# Patient Record
Sex: Female | Born: 1985 | Race: Asian | Hispanic: No | Marital: Married | Smoking: Never smoker
Health system: Southern US, Community
[De-identification: ages and names within clinical notes are randomized; demographics above are authoritative.]

---

## 2003-07-26 ENCOUNTER — Inpatient Hospital Stay (HOSPITAL_COMMUNITY): Admission: EM | Admit: 2003-07-26 | Discharge: 2003-07-27 | Payer: Self-pay

## 2003-12-31 ENCOUNTER — Other Ambulatory Visit: Admission: RE | Admit: 2003-12-31 | Discharge: 2003-12-31 | Payer: Self-pay | Admitting: Obstetrics and Gynecology

## 2004-03-25 ENCOUNTER — Other Ambulatory Visit: Admission: RE | Admit: 2004-03-25 | Discharge: 2004-03-25 | Payer: Self-pay | Admitting: Obstetrics and Gynecology

## 2004-06-09 ENCOUNTER — Inpatient Hospital Stay (HOSPITAL_COMMUNITY): Admission: AD | Admit: 2004-06-09 | Discharge: 2004-06-12 | Payer: Self-pay | Admitting: Family Medicine

## 2004-06-09 ENCOUNTER — Ambulatory Visit: Payer: Self-pay | Admitting: Family Medicine

## 2004-07-08 ENCOUNTER — Other Ambulatory Visit: Admission: RE | Admit: 2004-07-08 | Discharge: 2004-07-08 | Payer: Self-pay | Admitting: Obstetrics & Gynecology

## 2005-10-26 ENCOUNTER — Ambulatory Visit (HOSPITAL_COMMUNITY): Admission: RE | Admit: 2005-10-26 | Discharge: 2005-10-26 | Payer: Self-pay | Admitting: Obstetrics & Gynecology

## 2006-03-05 ENCOUNTER — Ambulatory Visit (HOSPITAL_COMMUNITY): Admission: RE | Admit: 2006-03-05 | Discharge: 2006-03-05 | Payer: Self-pay | Admitting: Obstetrics & Gynecology

## 2006-03-07 ENCOUNTER — Inpatient Hospital Stay (HOSPITAL_COMMUNITY): Admission: AD | Admit: 2006-03-07 | Discharge: 2006-03-09 | Payer: Self-pay | Admitting: Obstetrics & Gynecology

## 2018-09-12 LAB — OB RESULTS CONSOLE HIV ANTIBODY (ROUTINE TESTING): HIV: NONREACTIVE

## 2018-09-12 LAB — OB RESULTS CONSOLE HEPATITIS B SURFACE ANTIGEN: Hepatitis B Surface Ag: NEGATIVE

## 2018-09-12 LAB — OB RESULTS CONSOLE RPR: RPR: NONREACTIVE

## 2018-09-12 LAB — OB RESULTS CONSOLE GC/CHLAMYDIA
Chlamydia: NEGATIVE
Gonorrhea: NEGATIVE

## 2018-09-12 LAB — OB RESULTS CONSOLE RUBELLA ANTIBODY, IGM: Rubella: IMMUNE

## 2018-09-12 LAB — OB RESULTS CONSOLE ABO/RH: RH Type: POSITIVE

## 2018-09-12 LAB — OB RESULTS CONSOLE PLATELET COUNT: Platelets: 256

## 2018-09-12 LAB — OB RESULTS CONSOLE HGB/HCT, BLOOD
HCT: 40 (ref 29–41)
Hemoglobin: 13.3

## 2019-01-20 LAB — OB RESULTS CONSOLE HIV ANTIBODY (ROUTINE TESTING): HIV: NONREACTIVE

## 2019-01-20 LAB — OB RESULTS CONSOLE HGB/HCT, BLOOD
HCT: 41 (ref 29–41)
Hemoglobin: 13.6

## 2019-01-20 LAB — OB RESULTS CONSOLE PLATELET COUNT: Platelets: 253

## 2019-03-20 ENCOUNTER — Emergency Department (HOSPITAL_COMMUNITY): Payer: 59

## 2019-03-20 ENCOUNTER — Encounter (HOSPITAL_COMMUNITY): Payer: Self-pay | Admitting: Emergency Medicine

## 2019-03-20 ENCOUNTER — Observation Stay (HOSPITAL_BASED_OUTPATIENT_CLINIC_OR_DEPARTMENT_OTHER)
Admission: EM | Admit: 2019-03-20 | Discharge: 2019-03-20 | Disposition: A | Discharge status: Home / Self Care | Payer: 59 | Source: Home / Self Care | Attending: Emergency Medicine | Admitting: Emergency Medicine

## 2019-03-20 ENCOUNTER — Other Ambulatory Visit: Payer: Self-pay

## 2019-03-20 DIAGNOSIS — Z3493 Encounter for supervision of normal pregnancy, unspecified, third trimester: Secondary | ICD-10-CM | POA: Diagnosis not present

## 2019-03-20 DIAGNOSIS — Z3A36 36 weeks gestation of pregnancy: Secondary | ICD-10-CM | POA: Insufficient documentation

## 2019-03-20 DIAGNOSIS — Z880 Allergy status to penicillin: Secondary | ICD-10-CM | POA: Insufficient documentation

## 2019-03-20 DIAGNOSIS — R0602 Shortness of breath: Secondary | ICD-10-CM | POA: Diagnosis not present

## 2019-03-20 DIAGNOSIS — O99280 Endocrine, nutritional and metabolic diseases complicating pregnancy, unspecified trimester: Secondary | ICD-10-CM | POA: Insufficient documentation

## 2019-03-20 DIAGNOSIS — Z885 Allergy status to narcotic agent status: Secondary | ICD-10-CM | POA: Insufficient documentation

## 2019-03-20 DIAGNOSIS — R110 Nausea: Secondary | ICD-10-CM

## 2019-03-20 DIAGNOSIS — J1289 Other viral pneumonia: Secondary | ICD-10-CM | POA: Insufficient documentation

## 2019-03-20 DIAGNOSIS — U071 COVID-19: Secondary | ICD-10-CM

## 2019-03-20 DIAGNOSIS — O98519 Other viral diseases complicating pregnancy, unspecified trimester: Secondary | ICD-10-CM | POA: Insufficient documentation

## 2019-03-20 DIAGNOSIS — E872 Acidosis: Secondary | ICD-10-CM | POA: Diagnosis not present

## 2019-03-20 DIAGNOSIS — O9852 Other viral diseases complicating childbirth: Secondary | ICD-10-CM | POA: Diagnosis not present

## 2019-03-20 LAB — CBC WITH DIFFERENTIAL/PLATELET
Abs Immature Granulocytes: 0.06 10*3/uL (ref 0.00–0.07)
Basophils Absolute: 0 10*3/uL (ref 0.0–0.1)
Basophils Relative: 0 %
Eosinophils Absolute: 0 10*3/uL (ref 0.0–0.5)
Eosinophils Relative: 1 %
HCT: 39.6 % (ref 36.0–46.0)
Hemoglobin: 13.3 g/dL (ref 12.0–15.0)
Immature Granulocytes: 1 %
Lymphocytes Relative: 15 %
Lymphs Abs: 0.9 10*3/uL (ref 0.7–4.0)
MCH: 27.7 pg (ref 26.0–34.0)
MCHC: 33.6 g/dL (ref 30.0–36.0)
MCV: 82.5 fL (ref 80.0–100.0)
Monocytes Absolute: 0.4 10*3/uL (ref 0.1–1.0)
Monocytes Relative: 6 %
Neutro Abs: 4.6 10*3/uL (ref 1.7–7.7)
Neutrophils Relative %: 77 %
Platelets: 187 10*3/uL (ref 150–400)
RBC: 4.8 MIL/uL (ref 3.87–5.11)
RDW: 13 % (ref 11.5–15.5)
WBC: 5.9 10*3/uL (ref 4.0–10.5)
nRBC: 0 % (ref 0.0–0.2)

## 2019-03-20 LAB — INFLUENZA PANEL BY PCR (TYPE A & B)
Influenza A By PCR: NEGATIVE
Influenza B By PCR: NEGATIVE

## 2019-03-20 LAB — BASIC METABOLIC PANEL
Anion gap: 11 (ref 5–15)
BUN: 6 mg/dL (ref 6–20)
CO2: 20 mmol/L — ABNORMAL LOW (ref 22–32)
Calcium: 8.4 mg/dL — ABNORMAL LOW (ref 8.9–10.3)
Chloride: 106 mmol/L (ref 98–111)
Creatinine, Ser: 0.58 mg/dL (ref 0.44–1.00)
GFR calc Af Amer: >60 mL/min (ref >60)
GFR calc non Af Amer: >60 mL/min (ref >60)
Glucose, Bld: 84 mg/dL (ref 70–99)
Potassium: 3.8 mmol/L (ref 3.5–5.1)
Sodium: 137 mmol/L (ref 135–145)

## 2019-03-20 LAB — HIV ANTIBODY (ROUTINE TESTING W REFLEX): HIV Screen 4th Generation wRfx: NONREACTIVE

## 2019-03-20 LAB — POC SARS CORONAVIRUS 2 AG -  ED: SARS Coronavirus 2 Ag: NEGATIVE

## 2019-03-20 MED ORDER — DEXTROMETHORPHAN POLISTIREX ER 30 MG/5ML PO SUER: 15.0000 mg | Freq: Every day | ORAL | 0 refills | Status: DC | PRN

## 2019-03-20 MED ORDER — FOLIC ACID 1 MG PO TABS
1.0000 mg | ORAL_TABLET | Freq: Every day | ORAL | Status: DC
  Administered 2019-03-20: 1 mg via ORAL
  Filled 2019-03-20: qty 1

## 2019-03-20 MED ORDER — ENOXAPARIN SODIUM 150 MG/ML ~~LOC~~ SOLN: 1.0000 mg/kg | Freq: Two times a day (BID) | SUBCUTANEOUS | Status: DC

## 2019-03-20 MED ORDER — ADULT MULTIVITAMIN W/MINERALS CH: 1.0000 | ORAL_TABLET | Freq: Every day | ORAL | Status: DC

## 2019-03-20 MED ORDER — ADULT MULTIVITAMIN W/MINERALS CH
1.0000 | ORAL_TABLET | Freq: Every day | ORAL | Status: DC
  Administered 2019-03-20: 1 via ORAL
  Filled 2019-03-20: qty 1

## 2019-03-20 MED ORDER — ACETAMINOPHEN 325 MG PO TABS
650.0000 mg | ORAL_TABLET | Freq: Four times a day (QID) | ORAL | Status: DC | PRN
  Administered 2019-03-20: 650 mg via ORAL
  Filled 2019-03-20: qty 2

## 2019-03-20 MED ORDER — ONDANSETRON HCL 4 MG/2ML IJ SOLN
4.0000 mg | Freq: Four times a day (QID) | INTRAMUSCULAR | Status: DC | PRN
  Filled 2019-03-20: qty 2

## 2019-03-20 MED ORDER — SODIUM CHLORIDE 0.9 % IV SOLN
500.0000 mg | Freq: Once | INTRAVENOUS | Status: AC
  Administered 2019-03-20: 500 mg via INTRAVENOUS
  Filled 2019-03-20: qty 500

## 2019-03-20 MED ORDER — ACETAMINOPHEN 500 MG PO TABS
1000.0000 mg | ORAL_TABLET | Freq: Once | ORAL | Status: AC
  Administered 2019-03-20: 1000 mg via ORAL
  Filled 2019-03-20: qty 2

## 2019-03-20 MED ORDER — DEXTROMETHORPHAN POLISTIREX ER 30 MG/5ML PO SUER
15.0000 mg | Freq: Every day | ORAL | Status: DC | PRN
  Administered 2019-03-20: 15 mg via ORAL
  Filled 2019-03-20 (×3): qty 5

## 2019-03-20 MED ORDER — ENOXAPARIN SODIUM 40 MG/0.4ML ~~LOC~~ SOLN
40.0000 mg | SUBCUTANEOUS | Status: DC
  Filled 2019-03-20: qty 0.4

## 2019-03-20 MED ORDER — ONDANSETRON HCL 4 MG PO TABS: 4.0000 mg | ORAL_TABLET | Freq: Four times a day (QID) | ORAL | Status: DC | PRN

## 2019-03-20 MED ORDER — FOLIC ACID 1 MG PO TABS: 1.0000 mg | ORAL_TABLET | Freq: Every day | ORAL | Status: DC

## 2019-03-20 NOTE — Discharge Summary (Addendum)
Family Medicine Teaching [facility] Discharge Summary  Patient name: Anna Haynes Medical record number: [id number] Date of birth: [birth year] Age: 33 y.o. Gender: female Date of Admission: 03/20/2019  Date of Discharge: 03/20/2019 Admitting Physician: Moses Manners, MD  Primary Care Provider: Mitchel Honour, DO Consultants: OB/gyn  Indication for Hospitalization: Shortness of breath, COVID-19 infection affecting pregnancy  Discharge Diagnoses/Problem List:  COVID-19 infection/atypical pneumonia Third trimester pregnancy Nausea/fever/fatigue/cough Hypocalcemia  Disposition: Home  Discharge Condition: Stable  Discharge Exam:  BP 104/70   Pulse (!) 103   Temp 99.8 F (37.7 C) (Oral)   Resp (!) 25   SpO2 96%   General: Alert oriented, no acute distress, Card: Regular rhythm, no murmurs appreciated, mildly tachycardic at approximately 105 on evaluation. Pulm: Patient's lungs are clear to auscultation bilaterally.  Patient is taking short breaths.  And is tachypneic in the mid 20s. Abd: Gravid, positive bowel sounds, nontender  Brief Hospital Course:   COVID-19 infection Patient presented to the emergency department with classical symptoms of COVID-19 including cough, muscle aches, fever, loss of taste and smell.  Her positive Covid test was on 12/4.  On evaluation in the ED she was satting in the high 90s on room air.  She was tachypneic in the mid 20s.  Chest x-ray showed patchy pulmonary opacities worrisome for atypical pneumonia.  Emergency room providers gave azithromycin 500 mg for 1 dose in the ED.  Family medicine paged for admission and she was admitted for observation.  On evaluation in the emergency department the patient remained to sat in the high 90s on room air.  She did complain of side pain from coughing but was not coughing on exam.  WBC-5.9, Hgb-13.3, patient's calcium was found to be mildly low at 8.4.  Primary team called her obstetrician who recommended  an NST to evaluate baby. Per Dr. Rana Snare if NST wnl can follow up as outpatient with OB.  NST was reassuring and patient was cleared by obstetrics for discharge.  Patient was discharged with dextromethorphan to help with cough.  He is instructed to follow-up with her OB after her quarantine has been completed.  If she had any questions or concerns she could reach out to her primary care provider or her obstetrician.  Issues for Follow Up:  1. COVID-19 infection, diagnosis 12/4 2. Third trimester pregnancy 3. Monitor O2 status and ensure no respiratory distress  4. Advised to quarantine   Significant Procedures:  03/20/2019-NST performed  Significant Labs and Imaging:  Recent Labs  Lab 03/20/19 0152  WBC 5.9  HGB 13.3  HCT 39.6  PLT 187   Recent Labs  Lab 03/20/19 0152  NA 137  K 3.8  CL 106  CO2 20*  GLUCOSE 84  BUN 6  CREATININE 0.58  CALCIUM 8.4*    Chest x-ray-lung volumes are low and patchy pulmonary opacities is worrisome for atypical pneumonia  Results/Tests Pending at Time of Discharge: None  Discharge Medications:  Allergies as of 03/20/2019      Reactions   Amoxicillin Hives   Codeine Hives      Medication List    STOP taking these medications   guaiFENesin 600 MG 12 hr tablet Commonly known as: MUCINEX     TAKE these medications   acetaminophen 325 MG tablet Commonly known as: TYLENOL Take 650 mg by mouth every 6 (six) hours as needed for mild pain or fever.   dextromethorphan 30 MG/5ML liquid Commonly known as: DELSYM Take 2.5 mLs (15 mg  total) by mouth daily as needed for cough.   folic acid 1 MG tablet Commonly known as: FOLVITE Take 1 tablet (1 mg total) by mouth daily. Start taking on: March 21, 2019   multivitamin with minerals Tabs tablet Take 1 tablet by mouth daily. Start taking on: March 21, 2019       Discharge Instructions: Please refer to Patient Instructions section of EMR for full details.  Patient was counseled  important signs and symptoms that should prompt return to medical care, changes in medications, dietary instructions, activity restrictions, and follow up appointments.   Follow-Up Appointments:   Follow-up Information    Morris, Jinny Blossom, DO. Call in 1 day(s).   Specialty: Obstetrics and Gynecology Why: Please call to schedule a follow up appointment with your OB/GYN ASAP Contact information: [address] [place], [place] [address] [phone]           Gifford Shave, MD 03/20/2019, 11:31 AM PGY-1, [place] Upper-Level Resident Addendum   I have discussed the above with the original author and agree with their documentation. My edits for correction/addition/clarification are above. Please see also any attending notes.   Caroline More, DO PGY-3, [place] [facility] 03/20/2019 11:35 AM  FPTS Service pager: [phone] (text pages welcome through [facility])

## 2019-03-20 NOTE — Progress Notes (Signed)
PT Cancellation Note  Patient Details Name: [name] MRN: [id number] DOB: [birth year]   Cancelled Treatment:    Reason Eval/Treat Not Completed: PT screened, no needs identified, will sign off; nursing reports patient mobilizing without difficulty.  They can check ambulatory pulse ox.  PT to sign off.    Reginia Naas 03/20/2019, 11:18 AM Magda Kiel, [place] [phone] 03/20/2019

## 2019-03-20 NOTE — Progress Notes (Addendum)
FPTS Interim Progress Note  Spoke to Dr. Corinna Capra, [facility] on-call for physicians for women where patient is seen as an outpatient.  Dr. Corinna Capra stated that since patient is stable and not requiring oxygen if she is discharged can be followed up as an outpatient.  Recommended he contact the OB rapid response nurse to get an NST.  If NST is normal can discharge with outpatient follow-up in 10 days, when patient is not contagious.  For cough recommended Tussionex 5 mL every 12 hours.  Appreciate OB recommendations.  Addendum: spoke to L&D, RR RN will get NST  Caroline More, DO 03/20/2019, 9:56 AM PGY-3, [place] [facility] Service pager [phone]

## 2019-03-20 NOTE — ED Notes (Signed)
Rapid OB notified- here at bedside- will place on monitor.

## 2019-03-20 NOTE — ED Provider Notes (Signed)
[facility] EMERGENCY DEPARTMENT Provider Note   CSN: [id number] Arrival date & time: 03/20/19  0133     History No chief complaint on file.   Anna Haynes is a 33 y.o. female.  The history is provided by the patient.  Illness Location:  At home, as her husband was covid positive and she has tested positive for covid.   Quality:  Cough and body aches and feels unwell Severity:  Moderate Onset quality:  Gradual Duration:  6 days Timing:  Constant Progression:  Unchanged Chronicity:  New Context:  Tested covid positive Relieved by:  Nothing Worsened by:  Nothing Ineffective treatments:  None Associated symptoms: cough, fatigue and myalgias   Associated symptoms: no chest pain, no fever, no rash, no rhinorrhea, no sore throat, no vomiting and no wheezing   Associated symptoms comment:  Loss of taste and smell Cough:    Cough characteristics:  Non-productive   Severity:  Moderate   Onset quality:  Gradual   Timing:  Intermittent   Progression:  Unchanged   Chronicity:  New Risk factors:  Covid positive  Patient is 36 weeks      History reviewed. No pertinent past medical history.  There are no problems to display for this patient.   History reviewed. No pertinent surgical history.   OB History   No obstetric history on file.     History reviewed. No pertinent family history.  Social History   Tobacco Use  . Smoking status: Not on file  Substance Use Topics  . Alcohol use: Not on file  . Drug use: Not on file    Home Medications Prior to Admission medications   Not on File    Allergies    Patient has no allergy information on record.  Review of Systems   Review of Systems  Constitutional: Positive for fatigue. Negative for fever.  HENT: Negative for rhinorrhea and sore throat.   Eyes: Negative for visual disturbance.  Respiratory: Positive for cough. Negative for wheezing.   Cardiovascular: Negative for chest pain,  palpitations and leg swelling.  Gastrointestinal: Negative for vomiting.  Genitourinary: Negative for difficulty urinating.  Musculoskeletal: Positive for myalgias.  Skin: Negative for rash.  Neurological: Negative for dizziness.  Psychiatric/Behavioral: Negative for agitation.  All other systems reviewed and are negative.   Physical Exam Updated Vital Signs There were no vitals taken for this visit.  Physical Exam Vitals and nursing note reviewed.  Constitutional:      General: She is not in acute distress.    Appearance: Normal appearance. She is not ill-appearing.  HENT:     Head: Normocephalic and atraumatic.     Nose: Nose normal.  Eyes:     Conjunctiva/sclera: Conjunctivae normal.     Pupils: Pupils are equal, round, and reactive to light.  Cardiovascular:     Rate and Rhythm: Normal rate and regular rhythm.     Pulses: Normal pulses.     Heart sounds: Normal heart sounds.  Pulmonary:     Effort: Pulmonary effort is normal. No respiratory distress.     Breath sounds: Normal breath sounds. No wheezing.  Abdominal:     General: Abdomen is flat. Bowel sounds are normal.     Tenderness: There is no abdominal tenderness. There is no guarding.  Musculoskeletal:        General: No tenderness. Normal range of motion.     Cervical back: Normal range of motion and neck supple.  Right lower leg: No edema.     Left lower leg: No edema.  Skin:    General: Skin is warm and dry.     Capillary Refill: Capillary refill takes less than 2 seconds.  Neurological:     General: No focal deficit present.     Mental Status: She is alert and oriented to person, place, and time.  Psychiatric:        Mood and Affect: Mood normal.        Behavior: Behavior normal.     ED Results / Procedures / Treatments   Labs (all labs ordered are listed, but only abnormal results are displayed) Labs Reviewed  BASIC METABOLIC PANEL  CBC WITH DIFFERENTIAL/PLATELET    EKG EKG Interpretation   Date/Time:  Thursday March 20 2019 01:41:56 EST Ventricular Rate:  112 PR Interval:  136 QRS Duration: 76 QT Interval:  302 QTC Calculation: 412 R Axis:   81 Text Interpretation: Sinus tachycardia Confirmed by Dory Horn) on 03/20/2019 2:59:51 AM   Radiology No results found.  Procedures Procedures (including critical care time)  Medications Ordered in ED Medications  azithromycin (ZITHROMAX) 500 mg in sodium chloride 0.9 % 250 mL IVPB (has no administration in time range)  acetaminophen (TYLENOL) tablet 1,000 mg (1,000 mg Oral Given 03/20/19 0347)    ED Course  I have reviewed the triage vital signs and the nursing notes.  Pertinent labs & imaging results that were available during my care of the patient were reviewed by me and considered in my medical decision making (see chart for details).   I do not think this is PE she is not having CP or leg pain.  I do believe this is covid and that the patient feels poorly as she is probably at her most symptomatic right now based on when she was exposed and when she had an outpatient swab.  I have discussed this with the family practice resident.  We will not be ordering the covid lab panel at this time.    Final Clinical Impression(s) / ED Diagnoses Admit to family practice for covid care.     Larsen Zettel, MD 03/20/19 [phone]

## 2019-03-20 NOTE — ED Notes (Signed)
Admitting at bedside 

## 2019-03-20 NOTE — ED Notes (Signed)
Breakfast Ordered 

## 2019-03-20 NOTE — ED Notes (Signed)
Spoke with family medicine MD, patient is ready for discharge.

## 2019-03-20 NOTE — ED Notes (Signed)
Gave pt.breakfast order to patient

## 2019-03-20 NOTE — ED Triage Notes (Signed)
Pt c/o bodyaches, sob, and generalized body aches since being diagnosed with Covid on Friday. Last took tylenol yesterday. Pt is 22months pregnant, EDD 04/17/2019; pt at [facility]; denies pregnancy complaints. FHR 170

## 2019-03-20 NOTE — ED Notes (Signed)
Pt still feeling short of breath-- does not feel well-- grunting with breathing-

## 2019-03-20 NOTE — ED Notes (Signed)
Pt states-- I don't think I can live like this. Can't they do an emergency C-section?" will recheck after meds given.

## 2019-03-20 NOTE — Discharge Instructions (Signed)
Thank you for allowing Anna Haynes to participate in your care!    You were admitted for evaluation for COVID-19 infection during pregnancy.  We consulted your obstetrician who recommended getting an NST.  The baby looked good on NST and you are cleared for discharge.  Your obstetrician would like you to continue to quarantine and only come for an office visit once the quarantine period is over.  Regarding your cough we are recommending dextromethorphan.  This is an over-the-counter medication that she can get at any local drugstore.  If you have any questions or concerns please feel free to reach out to your primary care physician or your obstetrician.  Please call and schedule an appointment with your OB/GYN as soon as possible.  You may not be able to go in person for another 10 days but you should still go and try and schedule a virtual visit ASAP.  If you experience worsening of your admission symptoms, develop shortness of breath, life threatening emergency, suicidal or homicidal thoughts you must seek medical attention immediately by calling 911 or calling your MD immediately  if symptoms less severe.   Pregnancy and COVID-19 Coronavirus disease, also called COVID-19, is an infection of the lungs and airways (respiratory tract). It is unclear at this time if pregnancy makes it more likely for you to get COVID-19, or what effects the infection may have on your unborn baby. However, pregnancy causes changes to your heart, lungs, and the body's disease-fighting system (immune system). Some of these changes make it more likely for you to get sick and have more serious illness. Therefore, it is important for you to take precautions in order to protect yourself and your unborn baby. Work with your health care team to develop a plan to protect yourself from all infections, including COVID-19. This is one way for you to stay healthy during your pregnancy and to keep your baby healthy as well. How does this  affect me? If you get COVID-19, there is a risk that you may:  Get a respiratory illness that can lead to pneumonia.  Give birth to your baby before 37 weeks of pregnancy (premature birth). If you have or may have COVID-19, your health care provider may recommend special precautions around your pregnancy. This may affect how you:  Receive care before delivery (prenatal care). How you visit your health care provider may change. Tests and scans may need to be performed differently.  Receive care during labor and delivery. This may affect your birth plan, including who may be with you during labor and delivery.  Receive care after you deliver your baby (postpartum care). You may stay longer in the hospital, and in a special room. Your baby may also need to stay away from you.  Feed your baby after he or she is born. Pregnancy can be an especially stressful time because of the changes in your body and the preparation involved in becoming a parent. In addition, you may be feeling especially fearful, anxious, or stressed because of COVID-19 and how it is affecting you. How does this affect my baby? It is not known whether a mother will transmit the virus to her unborn baby. There is a risk that if you get COVID-19:  The virus that causes COVID-19 can pass to your baby.  You may have premature birth. Your baby may require more medical care if this happens. What can I do to lower my risk?  There is no vaccine to help prevent COVID-19. However,  there are actions that you can take to protect yourself and others from this virus. Cleaning and personal hygiene  Wash your hands often with soap and water for at least 20 seconds. If soap and water are not available, use alcohol-based hand sanitizer.  Avoid touching your mouth, face, eyes, or nose.  Clean and disinfect objects and surfaces that are frequently touched every day. This may include: ? Counters and tables. ? Doorknobs and light  switches. ? Sinks and faucets. ? Electronics such as phones, remote controls, keyboards, computers, and tablets. Stay away from others  Stay away from people who are sick, if possible.  Avoid social gatherings and travel.  Stay home as much as possible. Follow these instructions: Breastfeeding It is not known if the virus that causes COVID-19 can pass through breast milk to your baby. You should make a plan for feeding your infant with your family and your health care team. If you have or may have COVID-19, your health care provider may recommend that you take precautions while breastfeeding, such as:  Washing your hands before feeding your baby.  Wearing a mask while feeding your baby.  Pumping or expressing breast milk to feed to your baby. If possible, ask someone in your household who is not sick to feed your baby the expressed breast milk. ? Wash your hands before touching pump parts. ? Wash and disinfect all pump parts after expressing milk. Follow the manufacturer's instructions to clean and disinfect all pump parts. General instructions  If you think you have a COVID-19 infection, contact your health care provider right away. Tell your health care provider that you think you may have a COVID-19 infection.  Follow your health care provider's instructions on taking medicines. Some medicines may be unsafe to take during pregnancy.  Cover your mouth and nose by wearing a mask or other cloth covering over your face when you go out in public.  Find ways to manage stress. These may include: ? Using relaxation techniques like meditation and deep breathing. ? Getting regular exercise. Most women can continue their usual exercise routine during pregnancy. Ask your health care provider what activities are safe for you. ? Seeking support from family, friends, or spiritual resources. If you cannot be together in person, you can still connect by phone calls, texts, video calls, or online  messaging. ? Spending time doing relaxing activities that you enjoy, like listening to music or reading a good book.  Ask for help if you have counseling or nutritional needs during pregnancy. Your health care provider can offer advice or refer you to resources or specialists who can help you with various needs.  Keep all follow-up visits as told by your health care provider. This is important. Where to find more information Centers for Disease Control and Prevention (CDC): AffordableShare.com.brwww.cdc.gov/coronavirus/2019-ncov/ World Health Organization [facility](WHO): PokerPortraits.eswww.who.int/news-room/q-a-detail/q-a-on-covid-19-pregnancy-childbirth-and-breastfeeding [employer] (ACOG): BuyDucts.dkwww.acog.org/patient-resources/faqs/pregnancy/coronavirus-pregnancy-and-breastfeeding Questions to ask your health care team  What should I do if I have COVID-19 symptoms?  How will COVID-19 affect my prenatal care visits, tests and scans, labor and delivery, and postpartum care?  Should I plan to breastfeed my baby?  Where can I find mental health resources?  Where can I find support if I have financial concerns? Contact a health care provider if:  You have signs and symptoms of infection, including a fever or cough. Tell your health care team that you think you may have a COVID-19 infection.  You have strong emotions, such as sadness or  anxiety.  You feel unsafe in your home and need help finding a safe place to live.  You have bloody or watery vaginal discharge or vaginal bleeding. Get help right away if:  You have signs or symptoms of labor before 37 weeks of pregnancy. These include: ? Contractions that are 5 minutes or less apart, or that increase in frequency, intensity, or length. ? Sudden, sharp pain in the abdomen or in the lower back. ? A gush or trickle of fluid from your vagina.  You have difficulty breathing.  You have chest pain. These symptoms may represent a serious problem that  is an emergency. Do not wait to see if the symptoms will go away. Get medical help right away. Call your local emergency services (911 in the [place] Do not drive yourself to the hospital. Summary  Coronavirus disease, also called COVID-19, is an infection of the lungs and airways (respiratory tract). It is unclear at this time if pregnancy makes you more susceptible to COVID-19 and what effects it may have on unborn babies.  It is important to take precautions to protect yourself and your developing baby. This includes washing your hands often, avoiding touching your mouth, face, eyes, or nose, avoiding social gatherings and travel, and staying away from people who are sick.  If you think you have a COVID-19 infection, contact your health care provider right away. Tell your health care provider that you think you may have a COVID-19 infection.  If you have or may have COVID-19, your health care provider may recommend special precautions during your pregnancy, labor and delivery, and after your baby is born. This information is not intended to replace advice given to you by your health care provider. Make sure you discuss any questions you have with your health care provider. Document Released: 07/23/2018 Document Revised: 07/23/2018 Document Reviewed: 07/23/2018 Elsevier Patient Education  2020 [employer].

## 2019-03-20 NOTE — ED Notes (Signed)
Pt states she is "just ready to go home"

## 2019-03-20 NOTE — Progress Notes (Addendum)
G3P2 at 36 weeks reports to [facility] with s/s of COVID+.  Tested positive last Friday.  SOB and dry cough.  Denies fever.  Sees Dr Uvaldo Bristle for [facility].   OBRR at bedside for requested NST. No c/o cramping, leaking or bleeding.  Good fetal movement noted.  Next prenatal visit is 12/16.  No fever. ST. Tachypnea.  O2 Sats at 97-98 on RA currently during NST.

## 2019-03-20 NOTE — Progress Notes (Signed)
FPTS Interim Progress Note  I was called to patient's room by nursing staff regarding the patient's discharge.  On arrival to room the patient was resting in bed.  Exam is essentially unchanged from prior exam.  She is tachypneic in the mid 20s.  She reports she is taking short shallow breaths because if she takes deep breaths it will cause her to cough and her ribs hurt so she does not want to cough.  Patient is on room air satting 98%.  She is mildly tachycardic when I enter the room at 104.  Patient reports she has not received any cough medicine or pain medicine since this morning.  I asked the nursing staff to please give her her dextromethorphan as well as Tylenol and we will watch her for an hour.  She may then be ready for discharge.  Gifford Shave, MD 03/20/2019, 1:13 PM PGY-1, [place] [facility] Service pager [phone]

## 2019-03-20 NOTE — Progress Notes (Signed)
FPTS Interim Progress Note  S: Patient reports she is doing all right in this having little to no difficulty breathing.  She does complain of a cough and congestion and rib pain when she coughs.  Patient denies any vaginal bleeding, leaking of fluids, contractions.  She reports "a lot" of fetal movement.  Patient 1 questions on what to take for her cough so that her ribs do not hurt as much.  O: BP 104/70   Pulse (!) 103   Temp 99.8 F (37.7 C) (Oral)   Resp (!) 25   SpO2 96%   General: Alert oriented, no acute distress, Card: Regular rhythm, no murmurs appreciated, mildly tachycardic at approximately 105 on evaluation. Pulm: Patient's lungs are clear to auscultation bilaterally.  Patient is taking short breaths.  And is tachypneic in the mid 20s. Abd: Gravid, positive bowel sounds, nontender  A/P: COVID-19 affecting pregnancy -Consulted her obstetrician who recommended an NST.  Also recommended Tussin for the cough.  Given test since pregnancy category and discussions with pharmacy we are going to give dextromethorphan. -NST was reactive, obstetrician was notified and patient was cleared for discharge -Patient is clear for discharge and will follow up with her obstetrician after her quarantine period is over.  Gifford Shave, MD 03/20/2019, 11:08 AM PGY-1, [place] Medicine Service pager [phone]

## 2019-03-20 NOTE — ED Notes (Signed)
Pt ambulated on spo2.. o2 stayed above 96% with strong gait

## 2019-03-20 NOTE — ED Notes (Signed)
MS/PUI

## 2019-03-20 NOTE — H&P (Signed)
Family Medicine Teaching [facility] Admission History and Physical Service Pager: [phone]  Patient name: Anna Anna Medical record number: [id number] Date of birth: 1985/08/25 Age: 33 y.o. Gender: female  Primary Care Provider: Mitchel Honour, DO Consultants: Consult obstetrics in a.m. Code Status: Full Preferred Emergency Contact: Anna Anna, [phone], spouse  Chief Complaint: Shortness of breath  Assessment and Plan: Anna Anna is a 33 y.o. female presenting with likely COVID-19 infection and third trimester and mild desaturation on room air.  Likely COVID-19 infection/atypical pneumonia Patient with classic hallmark symptoms of COVID-19 including loss of taste and smell, persistent dry cough, muscle aches, fevers.  Had positive Covid test at home on 12/4.  Chest x-ray showing patchy pulmonary opacities worrisome for atypical pneumonia.  Not requiring any O2 in room during exam, but does have subjective shortness of breath.  No indication to start remdesivir or Decadron at this time.  Keep on continuous pulse ox, can use supplemental O2 to keep goal above 95%.  Will reach out to patient's OB, Anna Anna, in the a.m. to discuss further plan of care from an obstetric standpoint. -Admit to family medicine teaching service, Anna Anna, observation -Vital signs per floor routine -Tylenol 600 mg every 6 hours for fever, muscle aches -Ambulate with pulse ox with PT -Continuous pulse ox, supplemental O2 to keep goal above 95% -Ordered ferritin, LDH, D-dimer -Lovenox 1 mg/kg for DVT prophylaxis  Third trimester pregnancy Now 35 weeks 0 days per patient.  Has had a routine pregnancy with no issues up to this admission.  Will reach out to patient's OB, Anna Anna, of women's health of [facility] in a.m. to get any additional recs from an OB standpoint. -Consider fetal heart tones or BPP -We will touch base with patient's primary OB for further  recommendations  Nausea/fever/fatigue/cough Patient endorsing some nausea while in the room.  Has had low-grade fever at home, along with muscle aches.  Can take Tylenol 650 mg every 6 hours.  Patient with persistent dry cough.  Can consider guaifenesin/dextromethorphan if symptoms continue.  Hypocalcemia Calcium 8.4.  No previous calcium to compare this to.  We will continue to follow  PMH is significant for 2 previous vaginal deliveries, oral surgery procedure  FEN/GI: regular diet Prophylaxis: lovenox  Disposition: likely home  History of Present Illness:  Anna Anna is a 33 y.o. female presenting with   Review Of Systems: Per HPI with the following additions:   Review of Systems  Constitutional: Positive for chills and fever.  Respiratory: Positive for cough, sputum production and shortness of breath. Negative for hemoptysis.   Cardiovascular: Negative for chest pain and palpitations.  Gastrointestinal: Negative for abdominal pain, diarrhea, nausea and vomiting.  Genitourinary: Negative for dysuria.  Musculoskeletal: Positive for myalgias.  Neurological: Positive for weakness and headaches.  Psychiatric/Behavioral: Negative for depression.    Patient Active Problem List   Diagnosis Date Noted  . Shortness of breath 03/20/2019    Past Medical History: History reviewed. No pertinent past medical history.  Past Surgical History: History reviewed. No pertinent surgical history.  Social History: Social History   Tobacco Use  . Smoking status: Not on file  Substance Use Topics  . Alcohol use: Not on file  . Drug use: Not on file   Additional social history:  Please also refer to relevant sections of EMR.  Family History: History reviewed. No pertinent family history.   Allergies and Medications: Allergies  Allergen Reactions  . Amoxicillin Hives  .  Codeine Hives   No current facility-administered medications on file prior to encounter.   Current  Outpatient Medications on File Prior to Encounter  Medication Sig Dispense Refill  . acetaminophen (TYLENOL) 325 MG tablet Take 650 mg by mouth every 6 (six) hours as needed for mild pain or fever.    Marland Kitchen guaiFENesin (MUCINEX) 600 MG 12 hr tablet Take by mouth 2 (two) times daily as needed for cough or to loosen phlegm.      Objective: BP 97/68   Pulse (!) 107   Temp 99.8 F (37.7 C) (Oral)   Resp (!) 24   SpO2 96%  Exam: General: 33 year old Latino female, appears uncomfortable Eyes: EOMI, PERRLA ENTM: Palpable cervical lymphadenopathy Cardiovascular: Tachycardia, no M/R/G.  Skin warm and dry Respiratory: Coarse rhonchi all lung fields, no respiratory distress, no accessory muscle use Gastrointestinal: Soft, nontender, nondistended, gravid uterus without tenderness to palpation MSK: 5/5 strength bilateral upper extremity, bilateral lower extremity Derm: Skin warm and dry Neuro: CN II through XII intact, no focal neuro deficit Psych: Appropriate, very pleasant  Labs and Imaging: CBC BMET  Recent Labs  Lab 03/20/19 0152  WBC 5.9  HGB 13.3  HCT 39.6  PLT 187   Recent Labs  Lab 03/20/19 0152  NA 137  K 3.8  CL 106  CO2 20*  BUN 6  CREATININE 0.58  GLUCOSE 84  CALCIUM 8.4*     EKG: normal sinus rhythm  Guadalupe Dawn, MD 03/20/2019, 5:40 AM PGY-3, [place] Intern pager: [phone], text pages welcome

## 2019-03-20 NOTE — Progress Notes (Signed)
Dr Louretta Shorten updated on reactive and reassuring NST.  Needs to complete her quarantine prior to visiting office for PNV.  Reminded pt to remind office of COVID status.  Cleared by OB service.

## 2019-03-21 LAB — SARS CORONAVIRUS 2 (TAT 6-24 HRS): SARS Coronavirus 2: POSITIVE — AB

## 2019-03-21 LAB — ABO/RH: ABO/RH(D): B POS

## 2019-03-22 ENCOUNTER — Emergency Department (HOSPITAL_COMMUNITY): Payer: 59

## 2019-03-22 ENCOUNTER — Other Ambulatory Visit: Payer: Self-pay

## 2019-03-22 ENCOUNTER — Encounter (HOSPITAL_COMMUNITY): Payer: Self-pay | Admitting: Emergency Medicine

## 2019-03-22 ENCOUNTER — Inpatient Hospital Stay (HOSPITAL_COMMUNITY)
Admission: EM | Admit: 2019-03-22 | Discharge: 2019-03-26 | DRG: 805 | Disposition: A | Discharge status: Home / Self Care | Payer: 59 | Attending: Family Medicine | Admitting: Family Medicine

## 2019-03-22 DIAGNOSIS — O9852 Other viral diseases complicating childbirth: Principal | ICD-10-CM | POA: Diagnosis present

## 2019-03-22 DIAGNOSIS — U071 COVID-19: Secondary | ICD-10-CM | POA: Diagnosis present

## 2019-03-22 DIAGNOSIS — E872 Acidosis, unspecified: Secondary | ICD-10-CM

## 2019-03-22 DIAGNOSIS — T375X5A Adverse effect of antiviral drugs, initial encounter: Secondary | ICD-10-CM | POA: Diagnosis not present

## 2019-03-22 DIAGNOSIS — O99284 Endocrine, nutritional and metabolic diseases complicating childbirth: Secondary | ICD-10-CM | POA: Diagnosis present

## 2019-03-22 DIAGNOSIS — R0602 Shortness of breath: Secondary | ICD-10-CM

## 2019-03-22 DIAGNOSIS — Z3A36 36 weeks gestation of pregnancy: Secondary | ICD-10-CM

## 2019-03-22 DIAGNOSIS — J1289 Other viral pneumonia: Secondary | ICD-10-CM | POA: Diagnosis present

## 2019-03-22 DIAGNOSIS — J1282 Pneumonia due to coronavirus disease 2019: Secondary | ICD-10-CM

## 2019-03-22 DIAGNOSIS — J9601 Acute respiratory failure with hypoxia: Secondary | ICD-10-CM | POA: Diagnosis present

## 2019-03-22 DIAGNOSIS — O9952 Diseases of the respiratory system complicating childbirth: Secondary | ICD-10-CM | POA: Diagnosis present

## 2019-03-22 DIAGNOSIS — R945 Abnormal results of liver function studies: Secondary | ICD-10-CM | POA: Diagnosis not present

## 2019-03-22 MED ORDER — SODIUM CHLORIDE 0.9 % IV SOLN
1000.0000 mL | INTRAVENOUS | Status: DC
  Administered 2019-03-22: 1000 mL via INTRAVENOUS

## 2019-03-22 MED ORDER — ACETAMINOPHEN 325 MG PO TABS
650.0000 mg | ORAL_TABLET | Freq: Once | ORAL | Status: AC
  Administered 2019-03-22: 650 mg via ORAL
  Filled 2019-03-22: qty 2

## 2019-03-22 NOTE — ED Provider Notes (Addendum)
11:30 PM Assumed care from Dr. Roslynn Amble.  Patient is a 33 y.o. F who presents to ED with worsening SOB.  Has COVID x 8 days.  G3 P2 approx [redacted] weeks pregnant.  D/c'ed 2 days ago from [facility] from ED.  Slightly hypoxic here - 89% with ambulation.  On 2L  now.  More SOB.  Labs pending.  Needs readmission.  Rapid response called to evaluate baby as well in ED.  12:00 AM  Pt's chest x-ray shows worsening multifocal airspace opacities concerning for multifocal COVID-19 pneumonia.  I have updated patient's husband.  Lab work pending.  Will admit once lab work has resulted.  Novella Rob - husband - [phone]  2:15 AM  Pt continues to be stable.  Patient has a lactic of 1.2.  She has a bicarb of 12 and slightly elevated anion gap but normal blood glucose.  Husband reports she has not been eating and drinking well.  She is getting IV fluids.  Will check ABG.  Inflammatory markers elevated.  LFTs slightly elevated.  Discussed with rapid response OB nurse. She has consulted Dr. Gaetano Net. They will follow patient in consult.  Patient has been contracting intermittently but contractions are less frequent and she appears very comfortable and is asleep during these contractions.  She is not currently dilated.  Good fetal heart tones and normal accelerations.  We will discuss with family medicine for admission.  2:35 AM Discussed patient's case with FM resident, Dr. Criss Rosales.  I have recommended admission and patient (and family if present) agree with this plan. Admitting physician will place admission orders.   I reviewed all nursing notes, vitals, pertinent previous records and interpreted all EKGs, lab and urine results, imaging (as available).  2:55 AM  Pt's ABG shows a partially compensated metabolic acidosis.  pH of 7.33.  Bicarb is 10.9 and PCO2 is 20.  Patient has normal renal function.  Normal blood glucose.  Normal lactate.  Will check salicylate level.  Will repeat glucose.  Will check urinalysis.  Patient  still tachycardic and tachypneic but satting well on 2 L nasal cannula.  Will recheck temperature.   4:45 AM  FM has seen patient and has consulted ICU team.  ICU to admit.  They have ordered to put patient on BiPAP given increased work of breathing.  She is satting well on 2 L but is tachypneic and tachycardic.  They have ordered remdesivir and Decadron.  Urine shows large ketones and protein.  No sign of infection.  She is getting IV fluid hydration.  Repeat blood glucose is normal.  Salicylate level negative.  Suspect metabolic acidosis secondary to dehydration.  Patient unable to tolerate BiPAP.  ICU made aware.   EKG Interpretation  Date/Time:  Sunday March 23 2019 04:19:16 EST Ventricular Rate:  107 PR Interval:    QRS Duration: 81 QT Interval:  319 QTC Calculation: 426 R Axis:   85 Text Interpretation: Sinus tachycardia Borderline T abnormalities, diffuse leads Baseline wander in lead(s) II aVF No significant change since last tracing Confirmed by Jakobee Brackins, Cyril Mourning [phone]) on 03/23/2019 4:23:24 AM         CRITICAL CARE Performed by: Cyril Mourning Emoni Whitworth   Total critical care time: 45 minutes  Critical care time was exclusive of separately billable procedures and treating other patients.  Critical care was necessary to treat or prevent imminent or life-threatening deterioration.  Critical care was time spent personally by me on the following activities: development of treatment plan with patient and/or surrogate  as well as nursing, discussions with consultants, evaluation of patient's response to treatment, examination of patient, obtaining history from patient or surrogate, ordering and performing treatments and interventions, ordering and review of laboratory studies, ordering and review of radiographic studies, pulse oximetry and re-evaluation of patient's condition.    YAILYN STRACK was evaluated in Emergency Department on 03/23/2019 for the symptoms described in the history  of present illness. She was evaluated in the context of the global COVID-19 pandemic, which necessitated consideration that the patient might be at risk for infection with the SARS-CoV-2 virus that causes COVID-19. Institutional protocols and algorithms that pertain to the evaluation of patients at risk for COVID-19 are in a state of rapid change based on information released by regulatory bodies including the CDC and federal and state organizations. These policies and algorithms were followed during the patient's care in the ED.     Gaurav Baldree, Layla Maw, DO 03/23/19 1710

## 2019-03-22 NOTE — ED Provider Notes (Signed)
[facility] EMERGENCY DEPARTMENT Provider Note   CSN: [id number] Arrival date & time: 03/22/19  2208     History Chief Complaint  Patient presents with  . Shortness of Breath  . COVID+    Anna Haynes is a 33 y.o. female.  G3, P2 [redacted] weeks pregnant presents to ER with worsening shortness of breath in setting of known COVID-19 diagnosis.  States since she was discharged from hospital she has had steadily worsening shortness of breath.  States that she has some mild chest discomfort whenever she coughs, no chest pain at rest.  States shortness of breath is worse with any sort of exertion.  She denies any pelvic pain, no gush of fluids, no vaginal discharge or vaginal bleeding.  Noted good fetal movement.  Took some Tylenol earlier today, unsure if she had a fever.  No leg pain, abdominal pain.  States that she has had a very poor appetite and has not had anything to eat or drink today.  HPI     History reviewed. No pertinent past medical history.  Patient Active Problem List   Diagnosis Date Noted  . Shortness of breath 03/20/2019    History reviewed. No pertinent surgical history.   OB History    Gravida  1   Para      Term      Preterm      AB      Living        SAB      TAB      Ectopic      Multiple      Live Births              No family history on file.  Social History   Tobacco Use  . Smoking status: Never Smoker  . Smokeless tobacco: Never Used  Substance Use Topics  . Alcohol use: Never  . Drug use: Never    Home Medications Prior to Admission medications   Medication Sig Start Date End Date Taking? Authorizing Provider  acetaminophen (TYLENOL) 325 MG tablet Take 650 mg by mouth every 6 (six) hours as needed for mild pain or fever.    [provider]  dextromethorphan (DELSYM) 30 MG/5ML liquid Take 2.5 mLs (15 mg total) by mouth daily as needed for cough. 03/20/19   Oralia Manis, DO  folic acid (FOLVITE)  1 MG tablet Take 1 tablet (1 mg total) by mouth daily. 03/21/19   Oralia Manis, DO  Multiple Vitamin (MULTIVITAMIN WITH MINERALS) TABS tablet Take 1 tablet by mouth daily. 03/21/19   Oralia Manis, DO    Allergies    Amoxicillin and Codeine  Review of Systems   Review of Systems  Constitutional: Positive for chills and fever.  HENT: Negative for ear pain and sore throat.   Eyes: Negative for pain and visual disturbance.  Respiratory: Positive for cough and shortness of breath.   Cardiovascular: Positive for chest pain. Negative for palpitations.  Gastrointestinal: Negative for abdominal pain and vomiting.  Genitourinary: Negative for dysuria and hematuria.  Musculoskeletal: Positive for arthralgias, back pain and myalgias.  Skin: Negative for color change and rash.  Neurological: Negative for seizures and syncope.  All other systems reviewed and are negative.   Physical Exam Updated Vital Signs BP 116/80   Pulse (!) 112   Temp 100.1 F (37.8 C) (Oral)   Resp (!) 34   Ht 5' (1.524 m)   Wt 71.7 kg   SpO2 96%  Comment: 1L [place]  BMI 30.86 kg/m   Physical Exam Vitals and nursing note reviewed.  Constitutional:      General: She is not in acute distress.    Appearance: She is well-developed.  HENT:     Head: Normocephalic and atraumatic.  Eyes:     Conjunctiva/sclera: Conjunctivae normal.  Cardiovascular:     Rate and Rhythm: Regular rhythm. Tachycardia present.     Heart sounds: No murmur.  Pulmonary:     Comments: Mild tachypnea, mild increased work of breathing but no respiratory distress Abdominal:     Palpations: Abdomen is soft.     Tenderness: There is no abdominal tenderness.  Musculoskeletal:     Cervical back: Neck supple.     Right lower leg: No tenderness. No edema.     Left lower leg: No tenderness. No edema.  Skin:    General: Skin is warm and dry.     Capillary Refill: Capillary refill takes less than 2 seconds.  Neurological:     General: No  focal deficit present.     Mental Status: She is alert and oriented to person, place, and time.     ED Results / Procedures / Treatments   Labs (all labs ordered are listed, but only abnormal results are displayed) Labs Reviewed  CULTURE, BLOOD (ROUTINE X 2)  CULTURE, BLOOD (ROUTINE X 2)  LACTIC ACID, PLASMA  LACTIC ACID, PLASMA  CBC WITH DIFFERENTIAL/PLATELET  COMPREHENSIVE METABOLIC PANEL  D-DIMER, QUANTITATIVE (NOT AT [facility])  PROCALCITONIN  LACTATE DEHYDROGENASE  FERRITIN  TRIGLYCERIDES  FIBRINOGEN  C-REACTIVE PROTEIN    EKG None  Radiology No results found.  Procedures .Critical Care Performed by: Milagros Lollykstra, Durinda Buzzelli S, MD Authorized by: Milagros Lollykstra, Trask Vosler S, MD   Critical care provider statement:    Critical care time (minutes):  35   Critical care was necessary to treat or prevent imminent or life-threatening deterioration of the following conditions:  Respiratory failure and sepsis   Critical care was time spent personally by me on the following activities:  Discussions with consultants, evaluation of patient's response to treatment, examination of patient, ordering and performing treatments and interventions, ordering and review of laboratory studies, ordering and review of radiographic studies, pulse oximetry, re-evaluation of patient's condition, obtaining history from patient or surrogate and review of old charts   (including critical care time)  Medications Ordered in ED Medications  0.9 %  sodium chloride infusion (has no administration in time range)  acetaminophen (TYLENOL) tablet 650 mg (has no administration in time range)    ED Course  I have reviewed the triage vital signs and the nursing notes.  Pertinent labs & imaging results that were available during my care of the patient were reviewed by me and considered in my medical decision making (see chart for details).    MDM Rules/Calculators/A&P                      32 year old lady known COVID-19  diagnosis presents to ER with worsening shortness of breath, malaise, poor p.o. intake.  On exam patient is not in respiratory distress but does have some mild tachypnea and was reportedly hypoxic on ambulation trial with EMS.  Placed on 1 L nasal cannula.  Will ask [facility] to check for fetal monitoring.  Given her worsening symptoms, poor p.o. intake, believe patient will benefit from inpatient admission.  Covid admission labs ordered, pending at time of signout.  Anticipate admission to family medicine service for further  management.  Will defer decision on need for abx or anticoagulation once labs including dimer and PCT have been resulted.   Please refer to Dr. Guido Sander for final plan and disposition.  Final Clinical Impression(s) / ED Diagnoses Final diagnoses:  COVID-19  Pneumonia due to COVID-19 virus  Acute respiratory failure with hypoxia [facility])    Rx / DC Orders ED Discharge Orders    None       Lucrezia Starch, MD 03/22/19 2334

## 2019-03-22 NOTE — ED Triage Notes (Addendum)
Pt presents to ED from home BIB [facility]. Pt c/o worsening SOB and COVID positive. Pt dyspneic at rest. RR gets up to 45 at rest. Per EMS pt desatted to 8% on RA with exertion. Pt very weak. O2 sat 90-94% on RA and tachycardic on arrival. Pt also 36w pregnant. No pregnancy complaints

## 2019-03-22 NOTE — ED Notes (Signed)
Husband- Novella Rob, would like an update when possible at [phone]

## 2019-03-23 ENCOUNTER — Inpatient Hospital Stay (HOSPITAL_COMMUNITY): Payer: 59 | Admitting: Anesthesiology

## 2019-03-23 ENCOUNTER — Encounter (HOSPITAL_COMMUNITY): Payer: Self-pay | Admitting: *Deleted

## 2019-03-23 DIAGNOSIS — T375X5A Adverse effect of antiviral drugs, initial encounter: Secondary | ICD-10-CM | POA: Diagnosis not present

## 2019-03-23 DIAGNOSIS — J1282 Pneumonia due to coronavirus disease 2019: Secondary | ICD-10-CM

## 2019-03-23 DIAGNOSIS — O9952 Diseases of the respiratory system complicating childbirth: Secondary | ICD-10-CM | POA: Diagnosis present

## 2019-03-23 DIAGNOSIS — E872 Acidosis: Secondary | ICD-10-CM | POA: Diagnosis present

## 2019-03-23 DIAGNOSIS — U071 COVID-19: Secondary | ICD-10-CM | POA: Diagnosis present

## 2019-03-23 DIAGNOSIS — Z3A36 36 weeks gestation of pregnancy: Secondary | ICD-10-CM

## 2019-03-23 DIAGNOSIS — O9852 Other viral diseases complicating childbirth: Secondary | ICD-10-CM | POA: Diagnosis present

## 2019-03-23 DIAGNOSIS — O98513 Other viral diseases complicating pregnancy, third trimester: Secondary | ICD-10-CM

## 2019-03-23 DIAGNOSIS — J9601 Acute respiratory failure with hypoxia: Secondary | ICD-10-CM | POA: Diagnosis present

## 2019-03-23 DIAGNOSIS — O99284 Endocrine, nutritional and metabolic diseases complicating childbirth: Secondary | ICD-10-CM | POA: Diagnosis present

## 2019-03-23 DIAGNOSIS — R945 Abnormal results of liver function studies: Secondary | ICD-10-CM | POA: Diagnosis not present

## 2019-03-23 DIAGNOSIS — J1289 Other viral pneumonia: Secondary | ICD-10-CM | POA: Diagnosis present

## 2019-03-23 LAB — FERRITIN
Ferritin: 603 ng/mL — ABNORMAL HIGH (ref 11–307)
Ferritin: 620 ng/mL — ABNORMAL HIGH (ref 11–307)

## 2019-03-23 LAB — CBC WITH DIFFERENTIAL/PLATELET
Abs Immature Granulocytes: 0.3 10*3/uL — ABNORMAL HIGH (ref 0.00–0.07)
Basophils Absolute: 0 10*3/uL (ref 0.0–0.1)
Basophils Relative: 0 %
Eosinophils Absolute: 0 10*3/uL (ref 0.0–0.5)
Eosinophils Relative: 0 %
HCT: 41.1 % (ref 36.0–46.0)
Hemoglobin: 13.1 g/dL (ref 12.0–15.0)
Immature Granulocytes: 4 %
Lymphocytes Relative: 12 %
Lymphs Abs: 0.9 10*3/uL (ref 0.7–4.0)
MCH: 27.1 pg (ref 26.0–34.0)
MCHC: 31.9 g/dL (ref 30.0–36.0)
MCV: 85.1 fL (ref 80.0–100.0)
Monocytes Absolute: 0.4 10*3/uL (ref 0.1–1.0)
Monocytes Relative: 5 %
Neutro Abs: 5.9 10*3/uL (ref 1.7–7.7)
Neutrophils Relative %: 79 %
Platelets: 218 10*3/uL (ref 150–400)
RBC: 4.83 MIL/uL (ref 3.87–5.11)
RDW: 13.5 % (ref 11.5–15.5)
WBC: 7.5 10*3/uL (ref 4.0–10.5)
nRBC: 0.3 % — ABNORMAL HIGH (ref 0.0–0.2)

## 2019-03-23 LAB — CBG MONITORING, ED: Glucose-Capillary: 70 mg/dL (ref 70–99)

## 2019-03-23 LAB — COMPREHENSIVE METABOLIC PANEL
ALT: 56 U/L — ABNORMAL HIGH (ref 0–44)
ALT: 54 U/L — ABNORMAL HIGH (ref 0–44)
ALT: 61 U/L — ABNORMAL HIGH (ref 0–44)
AST: 89 U/L — ABNORMAL HIGH (ref 15–41)
AST: 91 U/L — ABNORMAL HIGH (ref 15–41)
AST: 96 U/L — ABNORMAL HIGH (ref 15–41)
Albumin: 2.5 g/dL — ABNORMAL LOW (ref 3.5–5.0)
Albumin: 2.3 g/dL — ABNORMAL LOW (ref 3.5–5.0)
Albumin: 2.4 g/dL — ABNORMAL LOW (ref 3.5–5.0)
Alkaline Phosphatase: 158 U/L — ABNORMAL HIGH (ref 38–126)
Alkaline Phosphatase: 172 U/L — ABNORMAL HIGH (ref 38–126)
Alkaline Phosphatase: 175 U/L — ABNORMAL HIGH (ref 38–126)
Anion gap: 16 — ABNORMAL HIGH (ref 5–15)
Anion gap: 15 (ref 5–15)
Anion gap: 15 (ref 5–15)
BUN: 6 mg/dL (ref 6–20)
BUN: 5 mg/dL — ABNORMAL LOW (ref 6–20)
BUN: 7 mg/dL (ref 6–20)
CO2: 12 mmol/L — ABNORMAL LOW (ref 22–32)
CO2: 12 mmol/L — ABNORMAL LOW (ref 22–32)
CO2: 12 mmol/L — ABNORMAL LOW (ref 22–32)
Calcium: 8.6 mg/dL — ABNORMAL LOW (ref 8.9–10.3)
Calcium: 8.7 mg/dL — ABNORMAL LOW (ref 8.9–10.3)
Calcium: 8.9 mg/dL (ref 8.9–10.3)
Chloride: 106 mmol/L (ref 98–111)
Chloride: 108 mmol/L (ref 98–111)
Chloride: 110 mmol/L (ref 98–111)
Creatinine, Ser: 0.83 mg/dL (ref 0.44–1.00)
Creatinine, Ser: 0.98 mg/dL (ref 0.44–1.00)
Creatinine, Ser: 0.69 mg/dL (ref 0.44–1.00)
GFR calc Af Amer: >60 mL/min (ref >60)
GFR calc Af Amer: >60 mL/min (ref >60)
GFR calc Af Amer: >60 mL/min (ref >60)
GFR calc non Af Amer: >60 mL/min (ref >60)
GFR calc non Af Amer: >60 mL/min (ref >60)
GFR calc non Af Amer: >60 mL/min (ref >60)
Glucose, Bld: 79 mg/dL (ref 70–99)
Glucose, Bld: 77 mg/dL (ref 70–99)
Glucose, Bld: 103 mg/dL — ABNORMAL HIGH (ref 70–99)
Potassium: 3.8 mmol/L (ref 3.5–5.1)
Potassium: 3.7 mmol/L (ref 3.5–5.1)
Potassium: 4.3 mmol/L (ref 3.5–5.1)
Sodium: 134 mmol/L — ABNORMAL LOW (ref 135–145)
Sodium: 135 mmol/L (ref 135–145)
Sodium: 137 mmol/L (ref 135–145)
Total Bilirubin: 1.9 mg/dL — ABNORMAL HIGH (ref 0.3–1.2)
Total Bilirubin: 1.5 mg/dL — ABNORMAL HIGH (ref 0.3–1.2)
Total Bilirubin: 1.6 mg/dL — ABNORMAL HIGH (ref 0.3–1.2)
Total Protein: 5.9 g/dL — ABNORMAL LOW (ref 6.5–8.1)
Total Protein: 6 g/dL — ABNORMAL LOW (ref 6.5–8.1)
Total Protein: 5.9 g/dL — ABNORMAL LOW (ref 6.5–8.1)

## 2019-03-23 LAB — URINALYSIS, ROUTINE W REFLEX MICROSCOPIC
Bacteria, UA: NONE SEEN
Bilirubin Urine: NEGATIVE
Glucose, UA: NEGATIVE mg/dL
Hgb urine dipstick: NEGATIVE
Ketones, ur: 80 mg/dL — AB
Leukocytes,Ua: NEGATIVE
Nitrite: NEGATIVE
Protein, ur: 30 mg/dL — AB
Specific Gravity, Urine: 1.018 (ref 1.005–1.030)
pH: 6 (ref 5.0–8.0)

## 2019-03-23 LAB — C-REACTIVE PROTEIN
CRP: 9.1 mg/dL — ABNORMAL HIGH (ref <1.0)
CRP: 9.6 mg/dL — ABNORMAL HIGH (ref <1.0)

## 2019-03-23 LAB — LACTIC ACID, PLASMA: Lactic Acid, Venous: 1.2 mmol/L (ref 0.5–1.9)

## 2019-03-23 LAB — CBC
HCT: 39.9 % (ref 36.0–46.0)
HCT: 40.1 % (ref 36.0–46.0)
Hemoglobin: 12.9 g/dL (ref 12.0–15.0)
Hemoglobin: 13.5 g/dL (ref 12.0–15.0)
MCH: 27.4 pg (ref 26.0–34.0)
MCH: 27.7 pg (ref 26.0–34.0)
MCHC: 32.3 g/dL (ref 30.0–36.0)
MCHC: 33.7 g/dL (ref 30.0–36.0)
MCV: 84.7 fL (ref 80.0–100.0)
MCV: 82.2 fL (ref 80.0–100.0)
Platelets: 226 10*3/uL (ref 150–400)
Platelets: 256 10*3/uL (ref 150–400)
RBC: 4.71 MIL/uL (ref 3.87–5.11)
RBC: 4.88 MIL/uL (ref 3.87–5.11)
RDW: 13.3 % (ref 11.5–15.5)
RDW: 13.4 % (ref 11.5–15.5)
WBC: 8.4 10*3/uL (ref 4.0–10.5)
WBC: 7.5 10*3/uL (ref 4.0–10.5)
nRBC: 0 % (ref 0.0–0.2)
nRBC: 0 % (ref 0.0–0.2)

## 2019-03-23 LAB — D-DIMER, QUANTITATIVE
D-Dimer, Quant: 1.72 ug/mL-FEU — ABNORMAL HIGH (ref 0.00–0.50)
D-Dimer, Quant: 1.83 ug/mL-FEU — ABNORMAL HIGH (ref 0.00–0.50)

## 2019-03-23 LAB — POCT I-STAT 7, (LYTES, BLD GAS, ICA,H+H)
Acid-base deficit: 13 mmol/L — ABNORMAL HIGH (ref 0.0–2.0)
Bicarbonate: 10.9 mmol/L — ABNORMAL LOW (ref 20.0–28.0)
Calcium, Ion: 1.26 mmol/L (ref 1.15–1.40)
HCT: 35 % — ABNORMAL LOW (ref 36.0–46.0)
Hemoglobin: 11.9 g/dL — ABNORMAL LOW (ref 12.0–15.0)
O2 Saturation: 94 %
Patient temperature: 100.1
Potassium: 3.7 mmol/L (ref 3.5–5.1)
Sodium: 134 mmol/L — ABNORMAL LOW (ref 135–145)
TCO2: 12 mmol/L — ABNORMAL LOW (ref 22–32)
pCO2 arterial: 20.8 mmHg — ABNORMAL LOW (ref 32.0–48.0)
pH, Arterial: 7.333 — ABNORMAL LOW (ref 7.350–7.450)
pO2, Arterial: 75 mmHg — ABNORMAL LOW (ref 83.0–108.0)

## 2019-03-23 LAB — MRSA PCR SCREENING: MRSA by PCR: NEGATIVE

## 2019-03-23 LAB — LACTATE DEHYDROGENASE
LDH: 334 U/L — ABNORMAL HIGH (ref 98–192)
LDH: 341 U/L — ABNORMAL HIGH (ref 98–192)

## 2019-03-23 LAB — TYPE AND SCREEN
ABO/RH(D): B POS
Antibody Screen: NEGATIVE

## 2019-03-23 LAB — SALICYLATE LEVEL: Salicylate Lvl: <7 mg/dL (ref 2.8–30.0)

## 2019-03-23 LAB — FIBRINOGEN: Fibrinogen: 536 mg/dL — ABNORMAL HIGH (ref 210–475)

## 2019-03-23 LAB — TRIGLYCERIDES: Triglycerides: 250 mg/dL — ABNORMAL HIGH (ref <150)

## 2019-03-23 LAB — PROCALCITONIN: Procalcitonin: 0.12 ng/mL

## 2019-03-23 MED ORDER — FLEET ENEMA 7-19 GM/118ML RE ENEM
1.0000 | ENEMA | RECTAL | Status: DC | PRN
  Filled 2019-03-23: qty 1

## 2019-03-23 MED ORDER — LACTATED RINGERS IV SOLN: 500.0000 mL | Freq: Once | INTRAVENOUS | Status: DC

## 2019-03-23 MED ORDER — SODIUM CHLORIDE 0.9 % IV SOLN
100.0000 mg | Freq: Every day | INTRAVENOUS | Status: DC
  Filled 2019-03-23: qty 20

## 2019-03-23 MED ORDER — EPHEDRINE 5 MG/ML INJ
10.0000 mg | INTRAVENOUS | Status: DC | PRN
  Filled 2019-03-23: qty 2

## 2019-03-23 MED ORDER — TERBUTALINE SULFATE 1 MG/ML IJ SOLN
0.2500 mg | Freq: Once | INTRAMUSCULAR | Status: DC | PRN
  Filled 2019-03-23: qty 1

## 2019-03-23 MED ORDER — OXYTOCIN 40 UNITS IN NORMAL SALINE INFUSION - SIMPLE MED
2.5000 [IU]/h | INTRAVENOUS | Status: DC
  Filled 2019-03-23 (×2): qty 1000

## 2019-03-23 MED ORDER — ONDANSETRON HCL 4 MG/2ML IJ SOLN: 4.0000 mg | Freq: Four times a day (QID) | INTRAMUSCULAR | Status: DC | PRN

## 2019-03-23 MED ORDER — LIDOCAINE HCL (PF) 1 % IJ SOLN
INTRAMUSCULAR | Status: DC | PRN
  Administered 2019-03-23: 2 mL via EPIDURAL
  Administered 2019-03-23: 10 mL via EPIDURAL

## 2019-03-23 MED ORDER — MISOPROSTOL 25 MCG QUARTER TABLET
25.0000 ug | ORAL_TABLET | ORAL | Status: DC | PRN
  Administered 2019-03-23: 13:00:00 25 ug via VAGINAL
  Filled 2019-03-23 (×2): qty 1

## 2019-03-23 MED ORDER — DIPHENHYDRAMINE HCL 50 MG/ML IJ SOLN: 12.5000 mg | INTRAMUSCULAR | Status: DC | PRN

## 2019-03-23 MED ORDER — SOD CITRATE-CITRIC ACID 500-334 MG/5ML PO SOLN
30.0000 mL | ORAL | Status: DC | PRN
  Filled 2019-03-23: qty 30

## 2019-03-23 MED ORDER — PHENYLEPHRINE 40 MCG/ML (10ML) SYRINGE FOR IV PUSH (FOR BLOOD PRESSURE SUPPORT)
80.0000 ug | PREFILLED_SYRINGE | INTRAVENOUS | Status: DC | PRN
  Filled 2019-03-23: qty 10

## 2019-03-23 MED ORDER — LACTATED RINGERS IV SOLN
500.0000 mL | INTRAVENOUS | Status: DC | PRN
  Administered 2019-03-23: 21:00:00 250 mL via INTRAVENOUS

## 2019-03-23 MED ORDER — LACTATED RINGERS IV SOLN
INTRAVENOUS | Status: DC
  Administered 2019-03-23: 17:00:00 via INTRAVENOUS

## 2019-03-23 MED ORDER — PHENYLEPHRINE 40 MCG/ML (10ML) SYRINGE FOR IV PUSH (FOR BLOOD PRESSURE SUPPORT)
80.0000 ug | PREFILLED_SYRINGE | INTRAVENOUS | Status: DC | PRN
  Filled 2019-03-23 (×2): qty 10

## 2019-03-23 MED ORDER — TERBUTALINE SULFATE 1 MG/ML IJ SOLN: 0.2500 mg | Freq: Once | INTRAMUSCULAR | Status: DC | PRN

## 2019-03-23 MED ORDER — SODIUM CHLORIDE (PF) 0.9 % IJ SOLN
INTRAMUSCULAR | Status: DC | PRN
  Administered 2019-03-23: 12 mL/h via EPIDURAL

## 2019-03-23 MED ORDER — CLINDAMYCIN PHOSPHATE 900 MG/50ML IV SOLN
900.0000 mg | Freq: Once | INTRAVENOUS | Status: AC
  Administered 2019-03-23: 900 mg via INTRAVENOUS
  Filled 2019-03-23: qty 50

## 2019-03-23 MED ORDER — SODIUM CHLORIDE 0.9 % IV SOLN
200.0000 mg | Freq: Once | INTRAVENOUS | Status: AC
  Administered 2019-03-23: 07:00:00 200 mg via INTRAVENOUS
  Filled 2019-03-23: qty 40

## 2019-03-23 MED ORDER — ORAL CARE MOUTH RINSE: 15.0000 mL | Freq: Two times a day (BID) | OROMUCOSAL | Status: DC

## 2019-03-23 MED ORDER — ENOXAPARIN SODIUM 40 MG/0.4ML ~~LOC~~ SOLN
40.0000 mg | SUBCUTANEOUS | Status: DC
  Filled 2019-03-23: qty 0.4

## 2019-03-23 MED ORDER — LACTATED RINGERS IV SOLN
INTRAVENOUS | Status: DC
  Administered 2019-03-23 (×2): via INTRAVENOUS

## 2019-03-23 MED ORDER — DEXAMETHASONE SODIUM PHOSPHATE 10 MG/ML IJ SOLN
6.0000 mg | Freq: Every day | INTRAMUSCULAR | Status: DC
  Administered 2019-03-23: 6 mg via INTRAVENOUS
  Filled 2019-03-23: qty 1

## 2019-03-23 MED ORDER — FENTANYL-BUPIVACAINE-NACL 0.5-0.125-0.9 MG/250ML-% EP SOLN
12.0000 mL/h | EPIDURAL | Status: DC | PRN
  Filled 2019-03-23: qty 250

## 2019-03-23 MED ORDER — OXYTOCIN BOLUS FROM INFUSION
500.0000 mL | Freq: Once | INTRAVENOUS | Status: AC
  Administered 2019-03-23: 21:00:00 500 mL via INTRAVENOUS

## 2019-03-23 MED ORDER — LIDOCAINE HCL (PF) 1 % IJ SOLN: 30.0000 mL | INTRAMUSCULAR | Status: DC | PRN

## 2019-03-23 MED ORDER — ACETAMINOPHEN 325 MG PO TABS: 650.0000 mg | ORAL_TABLET | ORAL | Status: DC | PRN

## 2019-03-23 MED ORDER — ERYTHROMYCIN 5 MG/GM OP OINT
TOPICAL_OINTMENT | OPHTHALMIC | Status: AC
  Filled 2019-03-23: qty 1

## 2019-03-23 MED ORDER — CHLORHEXIDINE GLUCONATE CLOTH 2 % EX PADS
6.0000 | MEDICATED_PAD | Freq: Every day | CUTANEOUS | Status: DC
  Administered 2019-03-23: 09:00:00 6 via TOPICAL

## 2019-03-23 MED ORDER — OXYTOCIN 40 UNITS IN NORMAL SALINE INFUSION - SIMPLE MED
1.0000 m[IU]/min | INTRAVENOUS | Status: DC
  Administered 2019-03-23: 17:00:00 2 m[IU]/min via INTRAVENOUS

## 2019-03-23 NOTE — Progress Notes (Signed)
Acknowledging that critical care team is admitting this patient and will be primary.  We will follow and can reassume care once the patient is deemed stable to leave the ICU.  Dr. Criss Rosales

## 2019-03-23 NOTE — Progress Notes (Signed)
[facility] responded to call from NP in ED regarding patient who is a G3P2 at 36.[redacted] weeks gestation who presented to ED for worsening COVID symptoms.  Pt was dx with COVID on 12/10, but sx worsened and she returned to be evaluated.  Reports patient will likely be admitted but would like fetal wellbeing and labor status to be assessed.  Patient put on FHR monitor and TOCO monitoring.  Contracting at the time of monitor placement and palpated moderate contraction at that time.  Pt on 1L of O2 by [place] and 1L NS bolus started by ED RN at that time.  Pt reports that she is getting her care from Dr. Gaetano Net.  +FM, + ctx for several weeks, + pain 8/10 in abdomen with ctx (consistent with how contractions have felt for several weeks), 9/10 in chest with coughing.  Denies LOF, vaginal bleeding, HA, epigastric pain, or visual disturbances.  No significant medical, surgical, or OB hx per patient.  Will monitor tracing and call MD once ctx pattern better established.

## 2019-03-23 NOTE — Progress Notes (Signed)
Operative Delivery Note At 9:14 PM a viable female was delivered via Vaginal, Spontaneous.  Presentation: vertex; Position: Right,, Occiput,, Anterior; Station: +4.  Verbal consent: obtained from patient.  Risks and benefits discussed in detail.  Risks include, but are not limited to the risks of anesthesia, bleeding, infection, damage to maternal tissues, fetal cephalhematoma.  There is also the risk of inability to effect vaginal delivery of the head, or shoulder dystocia that cannot be resolved by established maneuvers, leading to the need for emergency cesarean section.  Patient C/C/+4 with easy push. She is unable to push effectively secondary to coughing with effort. Therefore VE is used over one contraction. 2 fingers on VE for gently pull, no popoffs. Peds present outside door in anteroom observing.  APGAR: , ; weight  .   Placenta status: intact, to pathology .   Cord:3 vessels with the following complications: .  Cord pH: pending  Anesthesia:   Instruments: [place] Episiotomy:  None Lacerations:  Small second degree lac>repaired Suture Repair: 2.0 vicryl rapide Est. Blood Loss (mL):    Mom to postpartum.  Baby to nursing staff is consulting with pediatrician for instruction.  Shon Millet II 03/23/2019, 9:37 PM

## 2019-03-23 NOTE — Anesthesia Preprocedure Evaluation (Addendum)
Anesthesia Evaluation  Patient identified by MRN, date of birth, ID band Patient awake    Reviewed: Allergy & Precautions, Patient's Chart, lab work & pertinent test results  Airway Mallampati: II  TM Distance: >3 FB Neck ROM: Full    Dental no notable dental hx.    Pulmonary shortness of breath, pneumonia, unresolved,  covid pneumonia (tested positive 03/20/19 exposed by FOB)- SOB, tachypneic to 30-40s, hypoxia on RA on arrival to ED today. Reassuring fetal monitoring, decision to OB to proceed with IOL. Saturating 94-95% on [place] 2-4LPM   Pulmonary exam normal breath sounds clear to auscultation       Cardiovascular negative cardio ROS Normal cardiovascular exam Rhythm:Regular Rate:Normal     Neuro/Psych negative neurological ROS  negative psych ROS   GI/Hepatic negative GI ROS, Neg liver ROS,   Endo/Other  negative endocrine ROS  Renal/GU negative Renal ROS  negative genitourinary   Musculoskeletal negative musculoskeletal ROS (+)   Abdominal   Peds negative pediatric ROS (+)  Hematology negative hematology ROS (+)   Anesthesia Other Findings   Reproductive/Obstetrics (+) Pregnancy                             Anesthesia Physical Anesthesia Plan  ASA: III and emergent  Anesthesia Plan: Epidural   Post-op Pain Management:    Induction: Intravenous  PONV Risk Score and Plan: 2  Airway Management Planned: Natural Airway  Additional Equipment: None  Intra-op Plan:   Post-operative Plan:   Informed Consent: I have reviewed the patients History and Physical, chart, labs and discussed the procedure including the risks, benefits and alternatives for the proposed anesthesia with the patient or authorized representative who has indicated his/her understanding and acceptance.       Plan Discussed with:   Anesthesia Plan Comments: (D/W Dr. Gaetano Net, decision that early epidural  would be best for patient- to help prevent further tachypnea as well as for use in case of emergency c section to prevent airway manipulation in covid pneumonia.)       Anesthesia Quick Evaluation

## 2019-03-23 NOTE — Progress Notes (Addendum)
Dr Gaetano Net at bedside to discuss POC for induction of labor due to worsening s/s of COVID 19.  MFM recommending delivery at this time as well. Discussed options for induction with pt.  Pt in agreement. Awaiting 218 to be cleaned prior to transferring pt to L+D. This RN will assume care of pt when in L+D as well.  Pt is due for Lovenox.  Will hold per anesthesia.

## 2019-03-23 NOTE — Progress Notes (Signed)
OB  33 yo G3P2 @ 36 3/7 weeks Pregnancy uncomplicated until Covid + She was evaluated and discharged from ED 03/20/19 Presents today with increasing SOB. She C/O cough with deep inspiration. Denies ROM, vaginal bleeding. UCs intermittent and unchanged over last several days  Today's Vitals   03/23/19 0600 03/23/19 0700 03/23/19 0715 03/23/19 0716  BP: 104/67 107/67    Pulse: (!) 112 (!) 104 (!) 104   Resp: (!) 33 (!) 35 (!) 33   Temp:    98.5 F (36.9 C)  TempSrc:    Oral  SpO2: 96% 98% 95%   Weight:      Height:      PainSc:       Body mass index is 30.86 kg/m.   Uterus NT, soft  Cx FT/Th per nurse check FHT reactive with occasional mild/mod UC per nurse check this am  Results for orders placed or performed during the hospital encounter of 03/22/19 (from the past 24 hour(s))  CBC WITH DIFFERENTIAL     Status: Abnormal   Collection Time: 03/22/19 10:35 PM  Result Value Ref Range   WBC 7.5 4.0 - 10.5 K/uL   RBC 4.83 3.87 - 5.11 MIL/uL   Hemoglobin 13.1 12.0 - 15.0 g/dL   HCT 16.0 10.9 - 32.3 %   MCV 85.1 80.0 - 100.0 fL   MCH 27.1 26.0 - 34.0 pg   MCHC 31.9 30.0 - 36.0 g/dL   RDW 55.7 32.2 - 02.5 %   Platelets 218 150 - 400 K/uL   nRBC 0.3 (H) 0.0 - 0.2 %   Neutrophils Relative % 79 %   Neutro Abs 5.9 1.7 - 7.7 K/uL   Lymphocytes Relative 12 %   Lymphs Abs 0.9 0.7 - 4.0 K/uL   Monocytes Relative 5 %   Monocytes Absolute 0.4 0.1 - 1.0 K/uL   Eosinophils Relative 0 %   Eosinophils Absolute 0.0 0.0 - 0.5 K/uL   Basophils Relative 0 %   Basophils Absolute 0.0 0.0 - 0.1 K/uL   Immature Granulocytes 4 %   Abs Immature Granulocytes 0.30 (H) 0.00 - 0.07 K/uL  Comprehensive metabolic panel     Status: Abnormal   Collection Time: 03/22/19 10:35 PM  Result Value Ref Range   Sodium 134 (L) 135 - 145 mmol/L   Potassium 3.8 3.5 - 5.1 mmol/L   Chloride 106 98 - 111 mmol/L   CO2 12 (L) 22 - 32 mmol/L   Glucose, Bld 79 70 - 99 mg/dL   BUN 6 6 - 20 mg/dL   Creatinine, Ser  4.27 0.44 - 1.00 mg/dL   Calcium 8.6 (L) 8.9 - 10.3 mg/dL   Total Protein 5.9 (L) 6.5 - 8.1 g/dL   Albumin 2.5 (L) 3.5 - 5.0 g/dL   AST 89 (H) 15 - 41 U/L   ALT 56 (H) 0 - 44 U/L   Alkaline Phosphatase 158 (H) 38 - 126 U/L   Total Bilirubin 1.9 (H) 0.3 - 1.2 mg/dL   GFR calc non Af Amer >60 >60 mL/min   GFR calc Af Amer >60 >60 mL/min   Anion gap 16 (H) 5 - 15  D-dimer, quantitative     Status: Abnormal   Collection Time: 03/22/19 10:35 PM  Result Value Ref Range   D-Dimer, Quant 1.72 (H) 0.00 - 0.50 ug/mL-FEU  Procalcitonin     Status: None   Collection Time: 03/22/19 10:35 PM  Result Value Ref Range   Procalcitonin 0.12 ng/mL  Lactate  dehydrogenase     Status: Abnormal   Collection Time: 03/22/19 10:35 PM  Result Value Ref Range   LDH 334 (H) 98 - 192 U/L  Ferritin     Status: Abnormal   Collection Time: 03/22/19 10:35 PM  Result Value Ref Range   Ferritin 603 (H) 11 - 307 ng/mL  Triglycerides     Status: Abnormal   Collection Time: 03/22/19 10:35 PM  Result Value Ref Range   Triglycerides 250 (H) <150 mg/dL  Fibrinogen     Status: Abnormal   Collection Time: 03/22/19 10:35 PM  Result Value Ref Range   Fibrinogen 536 (H) 210 - 475 mg/dL  C-reactive protein     Status: Abnormal   Collection Time: 03/22/19 10:35 PM  Result Value Ref Range   CRP 9.1 (H) <1.0 mg/dL  Blood Culture (routine x 2)     Status: None (Preliminary result)   Collection Time: 03/22/19 10:44 PM   Specimen: BLOOD  Result Value Ref Range   Specimen Description BLOOD SITE NOT SPECIFIED    Special Requests      BOTTLES DRAWN AEROBIC AND ANAEROBIC Blood Culture results may not be optimal due to an inadequate volume of blood received in culture bottles   Culture      NO GROWTH < 12 HOURS Performed at [facility] [address], [place], [place] [address]    Report Status PENDING   Lactic acid, plasma     Status: None   Collection Time: 03/23/19 12:10 AM  Result Value Ref Range   Lactic  Acid, Venous 1.2 0.5 - 1.9 mmol/L  I-STAT 7, (LYTES, BLD GAS, ICA, H+H)     Status: Abnormal   Collection Time: 03/23/19  2:50 AM  Result Value Ref Range   pH, Arterial 7.333 (L) 7.350 - 7.450   pCO2 arterial 20.8 (L) 32.0 - 48.0 mmHg   pO2, Arterial 75.0 (L) 83.0 - 108.0 mmHg   Bicarbonate 10.9 (L) 20.0 - 28.0 mmol/L   TCO2 12 (L) 22 - 32 mmol/L   O2 Saturation 94.0 %   Acid-base deficit 13.0 (H) 0.0 - 2.0 mmol/L   Sodium 134 (L) 135 - 145 mmol/L   Potassium 3.7 3.5 - 5.1 mmol/L   Calcium, Ion 1.26 1.15 - 1.40 mmol/L   HCT 35.0 (L) 36.0 - 46.0 %   Hemoglobin 11.9 (L) 12.0 - 15.0 g/dL   Patient temperature 100.1 F    Collection site RADIAL, ALLEN'S TEST ACCEPTABLE    Sample type ARTERIAL   Salicylate level     Status: None   Collection Time: 03/23/19  2:56 AM  Result Value Ref Range   Salicylate Lvl <9.7 2.8 - 30.0 mg/dL  CBG monitoring, ED     Status: None   Collection Time: 03/23/19  3:11 AM  Result Value Ref Range   Glucose-Capillary 70 70 - 99 mg/dL  Urinalysis, Routine w reflex microscopic     Status: Abnormal   Collection Time: 03/23/19  4:46 AM  Result Value Ref Range   Color, Urine YELLOW YELLOW   APPearance CLEAR CLEAR   Specific Gravity, Urine 1.018 1.005 - 1.030   pH 6.0 5.0 - 8.0   Glucose, UA NEGATIVE NEGATIVE mg/dL   Hgb urine dipstick NEGATIVE NEGATIVE   Bilirubin Urine NEGATIVE NEGATIVE   Ketones, ur 80 (A) NEGATIVE mg/dL   Protein, ur 30 (A) NEGATIVE mg/dL   Nitrite NEGATIVE NEGATIVE   Leukocytes,Ua NEGATIVE NEGATIVE   RBC / HPF 0-5  0 - 5 RBC/hpf   WBC, UA 0-5 0 - 5 WBC/hpf   Bacteria, UA NONE SEEN NONE SEEN   Squamous Epithelial / LPF 0-5 0 - 5   Mucus PRESENT   CBC     Status: None   Collection Time: 03/23/19  5:26 AM  Result Value Ref Range   WBC 8.4 4.0 - 10.5 K/uL   RBC 4.71 3.87 - 5.11 MIL/uL   Hemoglobin 12.9 12.0 - 15.0 g/dL   HCT 09.839.9 11.936.0 - 14.746.0 %   MCV 84.7 80.0 - 100.0 fL   MCH 27.4 26.0 - 34.0 pg   MCHC 32.3 30.0 - 36.0 g/dL   RDW  82.913.3 56.211.5 - 13.015.5 %   Platelets 226 150 - 400 K/uL   nRBC 0.0 0.0 - 0.2 %  Comprehensive metabolic panel     Status: Abnormal   Collection Time: 03/23/19  5:26 AM  Result Value Ref Range   Sodium 135 135 - 145 mmol/L   Potassium 3.7 3.5 - 5.1 mmol/L   Chloride 108 98 - 111 mmol/L   CO2 12 (L) 22 - 32 mmol/L   Glucose, Bld 77 70 - 99 mg/dL   BUN 5 (L) 6 - 20 mg/dL   Creatinine, Ser 8.650.98 0.44 - 1.00 mg/dL   Calcium 8.7 (L) 8.9 - 10.3 mg/dL   Total Protein 6.0 (L) 6.5 - 8.1 g/dL   Albumin 2.3 (L) 3.5 - 5.0 g/dL   AST 91 (H) 15 - 41 U/L   ALT 54 (H) 0 - 44 U/L   Alkaline Phosphatase 172 (H) 38 - 126 U/L   Total Bilirubin 1.5 (H) 0.3 - 1.2 mg/dL   GFR calc non Af Amer >60 >60 mL/min   GFR calc Af Amer >60 >60 mL/min   Anion gap 15 5 - 15  Lactate dehydrogenase     Status: Abnormal   Collection Time: 03/23/19  5:26 AM  Result Value Ref Range   LDH 341 (H) 98 - 192 U/L  Ferritin     Status: Abnormal   Collection Time: 03/23/19  5:26 AM  Result Value Ref Range   Ferritin 620 (H) 11 - 307 ng/mL  D-dimer, quantitative ([facility])     Status: Abnormal   Collection Time: 03/23/19  5:26 AM  Result Value Ref Range   D-Dimer, Quant 1.83 (H) 0.00 - 0.50 ug/mL-FEU  C-reactive protein     Status: Abnormal   Collection Time: 03/23/19  5:26 AM  Result Value Ref Range   CRP 9.6 (H) <1.0 mg/dL   CXR> IMPRESSION: Worsening multifocal airspace opacities concerning for multifocal pneumonia (viral or bacterial).   A/P:36 3/7 wks         Covid + x 8 days with pneumonia progressing on CXR and worsening SOB         She is maitaining oxygen saturation 95-98% on 2 LPM [place]. She was unable to tolerate BIPAP         She is admitted to ICU         Remdesivir/dexamethasone ordered         Will start continuous FM         I D/W Dr Parke PoissonFang, MFM. He will see patient within the hour         I D/W patient and called her husband with OB update

## 2019-03-23 NOTE — Consult Note (Signed)
NAME:  Anna Haynes, MRN:  [id number], DOB:  26-Jul-1985, LOS: 0 ADMISSION DATE:  03/22/2019, CONSULTATION DATE:  03/23/2019 REFERRING MD:  FM teaching Service, CHIEF COMPLAINT:  dyspnea  Brief History   33 year old female diagnosed with Covid 2 days ago in her third trimester presents to the emergency room with worsening dyspnea.  History of present illness   Patient is a 33 year old female who tells me that she has had increasing difficulty with shortness of breath worse today and yesterday prompting repeat visit to the emergency room.  She was tested for Covid on the 10th and found to be positive.  On evaluation here she has right-sided infiltrates, is tachycardic with pulse over 100 mildly elevated liver function tests LDH of 330 with a ferritin of 600 CRP of 9.1.  Also has a bicarbonate of 12 with anion gap of 16.  She is mildly acidotic by arterial blood gas with a pH of 7.3 PCO2 of 20 PO2 of 75 O2 saturation over 90.  She reports a low-grade fever.  She also has had contractions although OB does not believe that she is urgently going to deliver, she is fingertip dilated and being fetal monitor.  I do not believe she is an active labor.  Past Medical History  [facility] Events   Admission 03/23/2019  Consults:  OB, FP, PCCM  Procedures:  NA  Significant Diagnostic Tests:  As above  Micro Data:  Covid positive  Antimicrobials:  NA  Interim history/subjective:  NA  Objective   Blood pressure 112/72, pulse (!) 112, temperature 99.5 F (37.5 C), temperature source Oral, resp. rate (!) 34, height 5' (1.524 m), weight 71.7 kg, SpO2 96 %.       No intake or output data in the 24 hours ending 03/23/19 0429 Filed Weights   03/22/19 2224  Weight: 71.7 kg    Examination: General: Well-developed female with dyspnea but not in distress HENT: Within normal limits Lungs: Right-sided crackles Cardiovascular: Tachycardic Abdomen: Benign bowel sounds  positive Extremities: Within normal limits no peripheral edema Neuro: Cranial nerves II through XII grossly intact focal deficits GU: N/A  Resolved Hospital Problem list   NA  Assessment & Plan:  1.  Severe Covid infection: Patient has elevated markers including ferritin LDH, she is tachycardic, her respiratory rate is in the 30s her pH is 7.33.  We will admit start on Medrol and remdesivir.  Will initiate BiPAP.  He is on high flow nasal cannula 10 L currently with O2 saturation over 90%.  Will need monitoring of arterial blood gas to insure correction of pH.  Medrol and remdesivir been started.  Discussed both drugs with pharmacy and her pregnancy is not a contraindication to the use Clinical severity of her illness  2.  Third trimester: OB following      Best practice:  Diet: N.p.o. for now Pain/Anxiety/Delirium protocol (if indicated): N/A VAP protocol (if indicated): N/A DVT prophylaxis: N/A GI prophylaxis: N/A Glucose control: We will monitor Mobility: Bedrest Code Status: Full Family Communication: Not available Disposition: Intensive care unit for further therapy, close monitoring of both patient along with fetal monitoring  Labs   CBC: Recent Labs  Lab 03/20/19 0152 03/22/19 2235 03/23/19 0250  WBC 5.9 7.5  --   NEUTROABS 4.6 5.9  --   HGB 13.3 13.1 11.9*  HCT 39.6 41.1 35.0*  MCV 82.5 85.1  --   PLT 187 218  --     Basic Metabolic Panel:  Recent Labs  Lab 03/20/19 0152 03/22/19 2235 03/23/19 0250  NA 137 134* 134*  K 3.8 3.8 3.7  CL 106 106  --   CO2 20* 12*  --   GLUCOSE 84 79  --   BUN 6 6  --   CREATININE 0.58 0.83  --   CALCIUM 8.4* 8.6*  --    GFR: Estimated Creatinine Clearance: 84.4 mL/min (by C-G formula based on SCr of 0.83 mg/dL). Recent Labs  Lab 03/20/19 0152 03/22/19 2235 03/23/19 0010  PROCALCITON  --  0.12  --   WBC 5.9 7.5  --   LATICACIDVEN  --   --  1.2    Liver Function Tests: Recent Labs  Lab 03/22/19 2235  AST  89*  ALT 56*  ALKPHOS 158*  BILITOT 1.9*  PROT 5.9*  ALBUMIN 2.5*   No results for input(s): LIPASE, AMYLASE in the last 168 hours. No results for input(s): AMMONIA in the last 168 hours.  ABG    Component Value Date/Time   PHART 7.333 (L) 03/23/2019 0250   PCO2ART 20.8 (L) 03/23/2019 0250   PO2ART 75.0 (L) 03/23/2019 0250   HCO3 10.9 (L) 03/23/2019 0250   TCO2 12 (L) 03/23/2019 0250   ACIDBASEDEF 13.0 (H) 03/23/2019 0250   O2SAT 94.0 03/23/2019 0250     Coagulation Profile: No results for input(s): INR, PROTIME in the last 168 hours.  Cardiac Enzymes: No results for input(s): CKTOTAL, CKMB, CKMBINDEX, TROPONINI in the last 168 hours.  HbA1C: No results found for: HGBA1C  CBG: Recent Labs  Lab 03/23/19 0311  GLUCAP 70    Review of Systems:   She complains of some chest discomfort dyspnea low-grade fever otherwise negative on review of systems  Past Medical History  She,  has no past medical history on file.   Surgical History   History reviewed. No pertinent surgical history.   Social History   reports that she has never smoked. She has never used smokeless tobacco. She reports that she does not drink alcohol or use drugs.   Family History   Her family history is not on file.   Allergies Allergies  Allergen Reactions  . Amoxicillin Hives  . Codeine Hives     Home Medications  Prior to Admission medications   Medication Sig Start Date End Date Taking? Authorizing Provider  acetaminophen (TYLENOL) 325 MG tablet Take 650 mg by mouth every 6 (six) hours as needed for mild pain or fever.    [provider]  dextromethorphan (DELSYM) 30 MG/5ML liquid Take 2.5 mLs (15 mg total) by mouth daily as needed for cough. 03/20/19   Oralia Manis, DO  folic acid (FOLVITE) 1 MG tablet Take 1 tablet (1 mg total) by mouth daily. 03/21/19   Oralia Manis, DO  Multiple Vitamin (MULTIVITAMIN WITH MINERALS) TABS tablet Take 1 tablet by mouth daily. 03/21/19    Oralia Manis, DO     Critical care time: 35 minutes was spent in chart review interviewing and examining patient and critical care planning.

## 2019-03-23 NOTE — Progress Notes (Addendum)
GBS PCR sent.  SCD bilateral below knees in place.

## 2019-03-23 NOTE — Progress Notes (Signed)
Updated provider on SVE, FHR tracing, and ctxs spacing out with fluids.  Order received to monitor patient for a few hours and call back with update.

## 2019-03-23 NOTE — Progress Notes (Signed)
Acknowledging that family medicine was called to admit this patient for comanagement with the OBG [facility] service under Dr. Gertie Fey.  We did go and evaluate the patient who is tachypneic and tachycardic, looking very uncomfortable with increased work of breathing although saturating at 96% on 2 L nasal cannula.  Per OBs vaginal evaluation, patient is only fingertip dilated with reassuring fetal monitoring which is now been discontinued with the plan of monitoring every shift.  As patient is not an active labor, we did discuss with the charge nurse of the labor and delivery floor who advised the policy is that this patient be admitted to the general adult section of the hospital with OB to round every shift.  Plan to admit for hypoxemic Covid positive.  Per policy called to notify ICU [facility] attending of admission of Covid positive patient requiring oxygen.  Confirmed plan was dexamethasone 6 mg which is already been ordered, given that the patient appears to have significant work of breathing and discomfort at 2 L, ICU team will come evaluate the patient to see if they feel it is more appropriate for her to be admitted to the ICU floor as opposed to progressive.  We will hold on placing admission orders as they are on their way down to see her.  -Full H&P to follow  Dr. Criss Rosales

## 2019-03-23 NOTE — ED Notes (Signed)
ED TO INPATIENT HANDOFF REPORT  ED Nurse Name and Phone #: [phone] Wendie Simmer., RN  S Name/Age/Gender Anna Haynes 33 y.o. female Room/Bed: 024C/024C  Code Status   Code Status: Full Code  Home/SNF/Other Home Patient oriented to: self, place, time and situation Is this baseline? Yes   Triage Complete: Triage complete  Chief Complaint COVID-19 [U07.1]  Triage Note Pt presents to ED from home BIB [facility]. Pt c/o worsening SOB and COVID positive. Pt dyspneic at rest. RR gets up to 45 at rest. Per EMS pt desatted to 8% on RA with exertion. Pt very weak. O2 sat 90-94% on RA and tachycardic on arrival. Pt also 36w pregnant. No pregnancy complaints    Allergies Allergies  Allergen Reactions  . Amoxicillin Hives  . Codeine Hives    Level of Care/Admitting Diagnosis ED Disposition    ED Disposition Condition Comment   Admit  Hospital Area: [facility] [[id number]]  Level of Care: ICU [6]  Covid Evaluation: Confirmed COVID Positive  Diagnosis: COVID-19 [[id number]]  Admitting Physician: Norman Clay [[id number]]  Attending Physician: Norman Clay [[id number]]  Estimated length of stay: past midnight tomorrow  Certification:: I certify this patient will need inpatient services for at least 2 midnights       B Medical/Surgery History History reviewed. No pertinent past medical history. History reviewed. No pertinent surgical history.   A IV Location/Drains/Wounds Patient Lines/Drains/Airways Status   Active Line/Drains/Airways    Name:   Placement date:   Placement time:   Site:   Days:   Peripheral IV 03/22/19 Left Antecubital   03/22/19    2253    Antecubital   1          Intake/Output Last 24 hours No intake or output data in the 24 hours ending 03/23/19 7846  Labs/Imaging Results for orders placed or performed during the hospital encounter of 03/22/19 (from the past 48 hour(s))  CBC WITH DIFFERENTIAL     Status: Abnormal   Collection Time:  03/22/19 10:35 PM  Result Value Ref Range   WBC 7.5 4.0 - 10.5 K/uL   RBC 4.83 3.87 - 5.11 MIL/uL   Hemoglobin 13.1 12.0 - 15.0 g/dL   HCT 96.2 95.2 - 84.1 %   MCV 85.1 80.0 - 100.0 fL   MCH 27.1 26.0 - 34.0 pg   MCHC 31.9 30.0 - 36.0 g/dL   RDW 32.4 40.1 - 02.7 %   Platelets 218 150 - 400 K/uL   nRBC 0.3 (H) 0.0 - 0.2 %   Neutrophils Relative % 79 %   Neutro Abs 5.9 1.7 - 7.7 K/uL   Lymphocytes Relative 12 %   Lymphs Abs 0.9 0.7 - 4.0 K/uL   Monocytes Relative 5 %   Monocytes Absolute 0.4 0.1 - 1.0 K/uL   Eosinophils Relative 0 %   Eosinophils Absolute 0.0 0.0 - 0.5 K/uL   Basophils Relative 0 %   Basophils Absolute 0.0 0.0 - 0.1 K/uL   Immature Granulocytes 4 %   Abs Immature Granulocytes 0.30 (H) 0.00 - 0.07 K/uL    Comment: Performed at [facility], [address] [place], [place] [address]  Comprehensive metabolic panel     Status: Abnormal   Collection Time: 03/22/19 10:35 PM  Result Value Ref Range   Sodium 134 (L) 135 - 145 mmol/L   Potassium 3.8 3.5 - 5.1 mmol/L   Chloride 106 98 - 111 mmol/L   CO2 12 (L) 22 -  32 mmol/L   Glucose, Bld 79 70 - 99 mg/dL   BUN 6 6 - 20 mg/dL   Creatinine, Ser 0.83 0.44 - 1.00 mg/dL   Calcium 8.6 (L) 8.9 - 10.3 mg/dL   Total Protein 5.9 (L) 6.5 - 8.1 g/dL   Albumin 2.5 (L) 3.5 - 5.0 g/dL   AST 89 (H) 15 - 41 U/L   ALT 56 (H) 0 - 44 U/L   Alkaline Phosphatase 158 (H) 38 - 126 U/L   Total Bilirubin 1.9 (H) 0.3 - 1.2 mg/dL   GFR calc non Af Amer >60 >60 mL/min   GFR calc Af Amer >60 >60 mL/min   Anion gap 16 (H) 5 - 15    Comment: Performed at [facility], [address], [place], [place] [address]  D-dimer, quantitative     Status: Abnormal   Collection Time: 03/22/19 10:35 PM  Result Value Ref Range   D-Dimer, Quant 1.72 (H) 0.00 - 0.50 ug/mL-FEU    Comment: (NOTE) At the manufacturer cut-off of 0.50 ug/mL FEU, this assay has been documented to exclude PE with a sensitivity and negative predictive value of 97 to  99%.  At this time, this assay has not been approved by the FDA to exclude DVT/VTE. Results should be correlated with clinical presentation. Performed at [facility], [place] [address] [place], [place] [address]   Procalcitonin     Status: None   Collection Time: 03/22/19 10:35 PM  Result Value Ref Range   Procalcitonin 0.12 ng/mL    Comment:        Interpretation: PCT (Procalcitonin) <= 0.5 ng/mL: Systemic infection (sepsis) is not likely. Local bacterial infection is possible. (NOTE)       Sepsis PCT Algorithm           Lower Respiratory Tract                                      Infection PCT Algorithm    ----------------------------     ----------------------------         PCT < 0.25 ng/mL                PCT < 0.10 ng/mL         Strongly encourage             Strongly discourage   discontinuation of antibiotics    initiation of antibiotics    ----------------------------     -----------------------------       PCT 0.25 - 0.50 ng/mL            PCT 0.10 - 0.25 ng/mL               OR       >80% decrease in PCT            Discourage initiation of                                            antibiotics      Encourage discontinuation           of antibiotics    ----------------------------     -----------------------------         PCT >= 0.50 ng/mL  PCT 0.26 - 0.50 ng/mL               AND        <80% decrease in PCT             Encourage initiation of                                             antibiotics       Encourage continuation           of antibiotics    ----------------------------     -----------------------------        PCT >= 0.50 ng/mL                  PCT > 0.50 ng/mL               AND         increase in PCT                  Strongly encourage                                      initiation of antibiotics    Strongly encourage escalation           of antibiotics                                     -----------------------------                                            PCT <= 0.25 ng/mL                                                 OR                                        > 80% decrease in PCT                                     Discontinue / Do not initiate                                             antibiotics Performed at [facility], [address] [place], [place] [address]   Lactate dehydrogenase     Status: Abnormal   Collection Time: 03/22/19 10:35 PM  Result Value Ref Range   LDH 334 (H) 98 - 192 U/L    Comment: Performed at [facility], [address] [place], [place] [address]  Ferritin     Status: Abnormal   Collection Time: 03/22/19 10:35 PM  Result Value Ref Range  Ferritin 603 (H) 11 - 307 ng/mL    Comment: Performed at [facility], [address] [place], [place] [address]  Triglycerides     Status: Abnormal   Collection Time: 03/22/19 10:35 PM  Result Value Ref Range   Triglycerides 250 (H) <150 mg/dL    Comment: Performed at [facility], [address] [place], [place] [address]  Fibrinogen     Status: Abnormal   Collection Time: 03/22/19 10:35 PM  Result Value Ref Range   Fibrinogen 536 (H) 210 - 475 mg/dL    Comment: Performed at [facility], [address], [place], [place] [address]  C-reactive protein     Status: Abnormal   Collection Time: 03/22/19 10:35 PM  Result Value Ref Range   CRP 9.1 (H) <1.0 mg/dL    Comment: Performed at [facility], [address] [place], [place] [address]  Lactic acid, plasma     Status: None   Collection Time: 03/23/19 12:10 AM  Result Value Ref Range   Lactic Acid, Venous 1.2 0.5 - 1.9 mmol/L    Comment: Performed at [facility], [address], [place], [place] [address]  I-STAT 7, (LYTES, BLD GAS, ICA, H+H)     Status: Abnormal   Collection Time: 03/23/19  2:50 AM  Result Value Ref Range   pH, Arterial 7.333 (L) 7.350 - 7.450   pCO2 arterial 20.8 (L) 32.0 - 48.0 mmHg   pO2, Arterial 75.0 (L) 83.0 -  108.0 mmHg   Bicarbonate 10.9 (L) 20.0 - 28.0 mmol/L   TCO2 12 (L) 22 - 32 mmol/L   O2 Saturation 94.0 %   Acid-base deficit 13.0 (H) 0.0 - 2.0 mmol/L   Sodium 134 (L) 135 - 145 mmol/L   Potassium 3.7 3.5 - 5.1 mmol/L   Calcium, Ion 1.26 1.15 - 1.40 mmol/L   HCT 35.0 (L) 36.0 - 46.0 %   Hemoglobin 11.9 (L) 12.0 - 15.0 g/dL   Patient temperature 944.9 F    Collection site RADIAL, ALLEN'S TEST ACCEPTABLE    Sample type ARTERIAL   Salicylate level     Status: None   Collection Time: 03/23/19  2:56 AM  Result Value Ref Range   Salicylate Lvl <7.0 2.8 - 30.0 mg/dL    Comment: Performed at [facility], [address], [place], [place] [address]  CBG monitoring, ED     Status: None   Collection Time: 03/23/19  3:11 AM  Result Value Ref Range   Glucose-Capillary 70 70 - 99 mg/dL  Urinalysis, Routine w reflex microscopic     Status: Abnormal   Collection Time: 03/23/19  4:46 AM  Result Value Ref Range   Color, Urine YELLOW YELLOW   APPearance CLEAR CLEAR   Specific Gravity, Urine 1.018 1.005 - 1.030   pH 6.0 5.0 - 8.0   Glucose, UA NEGATIVE NEGATIVE mg/dL   Hgb urine dipstick NEGATIVE NEGATIVE   Bilirubin Urine NEGATIVE NEGATIVE   Ketones, ur 80 (A) NEGATIVE mg/dL   Protein, ur 30 (A) NEGATIVE mg/dL   Nitrite NEGATIVE NEGATIVE   Leukocytes,Ua NEGATIVE NEGATIVE   RBC / HPF 0-5 0 - 5 RBC/hpf   WBC, UA 0-5 0 - 5 WBC/hpf   Bacteria, UA NONE SEEN NONE SEEN   Squamous Epithelial / LPF 0-5 0 - 5   Mucus PRESENT     Comment: Performed at [facility], [address] [place], [place]  [address]  CBC     Status: None   Collection Time: 03/23/19  5:26 AM  Result Value Ref Range   WBC 8.4 4.0 - 10.5 K/uL   RBC 4.71 3.87 - 5.11 MIL/uL   Hemoglobin 12.9 12.0 - 15.0 g/dL   HCT 19.1 47.8 - 29.5 %   MCV 84.7 80.0 - 100.0 fL   MCH 27.4 26.0 - 34.0 pg   MCHC 32.3 30.0 - 36.0 g/dL   RDW 62.1 30.8 - 65.7 %   Platelets 226 150 - 400 K/uL   nRBC 0.0 0.0 - 0.2 %    Comment:  Performed at [facility], [address], [place], [place] [address]  D-dimer, quantitative (not at [facility])     Status: Abnormal   Collection Time: 03/23/19  5:26 AM  Result Value Ref Range   D-Dimer, Quant 1.83 (H) 0.00 - 0.50 ug/mL-FEU    Comment: (NOTE) At the manufacturer cut-off of 0.50 ug/mL FEU, this assay has been documented to exclude PE with a sensitivity and negative predictive value of 97 to 99%.  At this time, this assay has not been approved by the FDA to exclude DVT/VTE. Results should be correlated with clinical presentation. Performed at [facility], [address], [place], [place] [address]    DG Chest Port 1 View  Result Date: 03/22/2019 CLINICAL DATA:  Pneumonia EXAM: PORTABLE CHEST 1 VIEW COMPARISON:  03/20/2019 FINDINGS: There are multifocal airspace opacities bilaterally which have progressed since the prior study. The lung volumes are low. The heart size is mildly enlarged. There is no pneumothorax. No acute osseous abnormality. IMPRESSION: Worsening multifocal airspace opacities concerning for multifocal pneumonia (viral or bacterial). Electronically Signed   By: Katherine Mantle M.D.   On: 03/22/2019 23:36    Pending Labs Unresulted Labs (From admission, onward)    Start     Ordered   03/30/19 0500  Creatinine, serum  (enoxaparin (LOVENOX)    CrCl >/= 30 ml/min)  Weekly,   R    Comments: while on enoxaparin therapy    03/23/19 0439   03/23/19 0500  Comprehensive metabolic panel  Once-Timed,   STAT     03/23/19 0409   03/23/19 0500  Lactate dehydrogenase  Once-Timed,   STAT     03/23/19 0409   03/23/19 0500  Ferritin  Once-Timed,   STAT     03/23/19 0409   03/23/19 0500  C-reactive protein  Once-Timed,   STAT     03/23/19 0409   03/22/19 2235  Blood Culture (routine x 2)  BLOOD CULTURE X 2,   STAT     03/22/19 2234          Vitals/Pain Today's Vitals   03/23/19 0530 03/23/19 0537 03/23/19 0540 03/23/19 0600  BP:    104/67  Pulse:  (!) 106 (!) 108 (!) 107 (!) 112  Resp: (!) 40 (!) 34 (!) 31 (!) 33  Temp:      TempSrc:      SpO2: 96% 96% 98% 96%  Weight:      Height:      PainSc:        Isolation Precautions Airborne and Contact precautions  Medications Medications  dexamethasone (DECADRON) injection 6 mg (6 mg Intravenous Given 03/23/19 0505)  lactated ringers infusion ( Intravenous New Bag/Given 03/23/19 0505)  remdesivir 200 mg in sodium chloride 0.9% 250 mL IVPB (200 mg Intravenous New Bag/Given 03/23/19 4132)    Followed by  remdesivir  100 mg in sodium chloride 0.9 % 100 mL IVPB (has no administration in time range)  enoxaparin (LOVENOX) injection 40 mg (has no administration in time range)  acetaminophen (TYLENOL) tablet 650 mg (650 mg Oral Given 03/22/19 2359)    Mobility walks Low fall risk   Focused Assessments Cardiac Assessment Handoff:    No results found for: CKTOTAL, CKMB, CKMBINDEX, TROPONINI Lab Results  Component Value Date   DDIMER 1.83 (H) 03/23/2019   Does the Patient currently have chest pain? No  , Pulmonary Assessment Handoff:  Lung sounds: Bilateral Breath Sounds: Clear, Diminished L Breath Sounds: Clear, Diminished R Breath Sounds: Clear, Diminished O2 Device: Nasal Cannula O2 Flow Rate (L/min): 2 L/min      R Recommendations: See Admitting Provider Note  Report given to:   Additional Notes:

## 2019-03-23 NOTE — ED Notes (Signed)
ED tech notified RN that pt O2 dropped to 85% while going to bedside commode. Pt's O2 sat came up quickly after resting.

## 2019-03-23 NOTE — Progress Notes (Signed)
FHT reactive, variable decels with UCs, good response to scalp stimulation Cx 4/C/-2 AROM clear Oxygen sat= 97%  3 LPM [place] RR 30s Pitocin running Epidural in>good pain relief

## 2019-03-23 NOTE — ED Notes (Signed)
Report given to 49M by Threasa Beards, RN

## 2019-03-23 NOTE — Progress Notes (Signed)
Dr. Gaetano Net called and notified of [place] @ 36.3 weeks with reactive FHR tracing at this time, baseline 150 with moderate variability and accels, no decels, contracting every 3-5 minutes palpating mild to moderate with pain rated 8/10 cramping abdominal pain. Notified of pt's sxs.  Order given to continue monitoring patient, perform SVE, and call provider back with update.

## 2019-03-23 NOTE — Progress Notes (Signed)
Patient states she "always contracts."  Unable to say a time when it started, but states she's contracted for months like she is currently contracting.  Rates pain 8/10 currently and states that it is also 8/10 at home and has been like this for weeks.  Palpates mild to moderate contractions, but no visible change or vocalization of pain while contracting noted. States pain with coughing is worse than cramping pain.

## 2019-03-23 NOTE — Progress Notes (Signed)
D/W patient's husband recommendation for IOL All questions answered

## 2019-03-23 NOTE — ED Notes (Signed)
Dr Criss Rosales called advised he is still working on getting this patient admitted--Anna Haynes

## 2019-03-23 NOTE — Progress Notes (Signed)
Seen on arrival to ICU. Looks stable clinically. Plan as written, would increase supplemental O2 to maintain sats > 95%. Await MFM input.  Erskine Emery MD PCCM

## 2019-03-23 NOTE — Progress Notes (Signed)
Notified Dr. Gaetano Net of SVE, FHR tracing, and contractions.  Pt able to sleep through ctx at this time and palpating mild.  Order given to take off monitor and do NST q shift with contraction monitoring at that time.

## 2019-03-23 NOTE — Progress Notes (Addendum)
G3P2 at 36 3/7 weeks is admitted to 32M for decompensating COVID 19 symptoms.  Dr Gaetano Net ordered 2 hours of continuous monitoring.  Pt reports good fetal movement, no bleeding, no leaking and no cxns. Pt with tachypnea >30/Afebrile/O2 Sats 95% on HFNC/2L.  Pt is very tired.  Sitting in high Fowler's.

## 2019-03-23 NOTE — ED Notes (Signed)
Urine culture sent with urinalysis.  

## 2019-03-23 NOTE — Progress Notes (Signed)
Attempted to place patient on BIPAP. As soon as bipap started her RR jumped to the 50s, she was very anxious and not tolerating well. I attempted to couch her on deep breathing and calming down, but that did not help. After 3 min she ripped it off and told me she could not breath. PT is on [facility] and states she is more comfortable breathing without the BiPAP. RT will continue to monitor.

## 2019-03-23 NOTE — Anesthesia Procedure Notes (Signed)
Epidural Patient location during procedure: OB Start time: 03/23/2019 6:15 PM End time: 03/23/2019 6:35 PM  Staffing Anesthesiologist: Pervis Hocking, DO Performed: anesthesiologist   Preanesthetic Checklist Completed: patient identified, IV checked, risks and benefits discussed, monitors and equipment checked, pre-op evaluation and timeout performed  Epidural Patient position: sitting Prep: DuraPrep and site prepped and draped Patient monitoring: continuous pulse ox, blood pressure, heart rate and cardiac monitor Approach: midline Location: L3-L4 Injection technique: LOR air  Needle:  Needle type: Tuohy  Needle gauge: 17 G Needle length: 9 cm Needle insertion depth: 5 cm Catheter type: closed end flexible Catheter size: 19 Gauge Catheter at skin depth: 10 cm Test dose: negative  Assessment Sensory level: T8 Events: blood not aspirated, injection not painful, no injection resistance, no paresthesia and negative IV test  Additional Notes Patient identified. Risks/Benefits/Options discussed with patient including but not limited to bleeding, infection, nerve damage, paralysis, failed block, incomplete pain control, headache, blood pressure changes, nausea, vomiting, reactions to medication both or allergic, itching and postpartum back pain. Confirmed with bedside nurse the patient's most recent platelet count. Confirmed with patient that they are not currently taking any anticoagulation, have any bleeding history or any family history of bleeding disorders. Patient expressed understanding and wished to proceed. All questions were answered. Sterile technique was used throughout the entire procedure. Please see nursing notes for vital signs. Test dose was given through epidural catheter and negative prior to continuing to dose epidural or start infusion. Warning signs of high block given to the patient including shortness of breath, tingling/numbness in hands, complete motor  block, or any concerning symptoms with instructions to call for help. Patient was given instructions on fall risk and not to get out of bed. All questions and concerns addressed with instructions to call with any issues or inadequate analgesia.  Reason for block:procedure for pain

## 2019-03-23 NOTE — Progress Notes (Signed)
D/W patient recommendation for IOL. D/W risks including failed induction and cesarean section. She states she understands and agrees.  Today's Vitals   03/23/19 1000 03/23/19 1015 03/23/19 1025 03/23/19 1100  BP: 98/72 106/75  110/84  Pulse: 97 95 95 84  Resp: (!) 36 (!) 35 (!) 33 (!) 33  Temp:      TempSrc:      SpO2: 96% 96% 95% 97%  Weight:      Height:      PainSc: Asleep   Asleep   Body mass index is 30.86 kg/m.   FHT baseline 115-120, good accelerations UCs about q7 min  Appreciate Dr Fritz Pickerel input  A/P: will transfer to L&D and begin 2 stage IOL         GBBS swab and clindamycin ordered (PCN allergy)         Repeat labs         D/W anesthesiology

## 2019-03-23 NOTE — Consult Note (Signed)
MFM Consult  This patient is a 33 year old multipara who was admitted to the ICU today due to worsening pneumonia due to Covid 19 infection.  The patient presented to the emergency department early this morning complaining of worsening shortness of breath.  She tested positive for COVID-19 three days ago.  On admission, the patient was noted to be tachypneic with a respiratory rate of over 30.  She also has mildly elevated liver function tests.  Right-sided infiltrates were noted on chest x-ray.  She was noted to be mildly acidotic on arterial blood gas. The patient was unable to tolerate BiPAP oxygen treatment earlier today.  She is receiving dexamethasone and remdesivir treatment for her Covid infection.   She is currently afebrile and clinically stable with O2 sats at around 95% to 97% on 2 L by nasal cannula.  She is extremely tired and is sleeping.  She is currently on continuous fetal heart rate monitoring.  The fetal status is reassuring.  Due to her worsening COVID-19 infection at her current gestational age, delivery is recommended as there would not be much further fetal benefit that would be gained with prolongation of the pregnancy.  Delivery would also enable the mother to receive all necessary treatments for COVID-19 without any further fetal concerns and may possibly help improve her respiratory status.  Dr. Gaetano Net will transfer the patient to labor and delivery to start the induction process.

## 2019-03-23 NOTE — Progress Notes (Signed)
Consent obtained for epidural procedure.

## 2019-03-23 NOTE — ED Notes (Signed)
RT made CCM MD aware that pt was not tolerating bipap. Pt became serevely tachpienic while using bipap. RT took pt off.

## 2019-03-23 NOTE — ED Notes (Signed)
+  ICU ordered bfast

## 2019-03-24 ENCOUNTER — Encounter (HOSPITAL_COMMUNITY): Payer: Self-pay | Admitting: Specialist

## 2019-03-24 DIAGNOSIS — U071 COVID-19: Secondary | ICD-10-CM

## 2019-03-24 LAB — CREATININE, SERUM
Creatinine, Ser: 0.59 mg/dL (ref 0.44–1.00)
GFR calc Af Amer: >60 mL/min (ref >60)
GFR calc non Af Amer: >60 mL/min (ref >60)

## 2019-03-24 LAB — CBC
HCT: 37.5 % (ref 36.0–46.0)
Hemoglobin: 12.7 g/dL (ref 12.0–15.0)
MCH: 27.5 pg (ref 26.0–34.0)
MCHC: 33.9 g/dL (ref 30.0–36.0)
MCV: 81.3 fL (ref 80.0–100.0)
Platelets: 217 10*3/uL (ref 150–400)
RBC: 4.61 MIL/uL (ref 3.87–5.11)
RDW: 13.4 % (ref 11.5–15.5)
WBC: 7.7 10*3/uL (ref 4.0–10.5)
nRBC: 0 % (ref 0.0–0.2)

## 2019-03-24 LAB — GLUCOSE, CAPILLARY
Glucose-Capillary: 96 mg/dL (ref 70–99)
Glucose-Capillary: 117 mg/dL — ABNORMAL HIGH (ref 70–99)

## 2019-03-24 LAB — RPR: RPR Ser Ql: NONREACTIVE

## 2019-03-24 MED ORDER — ASCORBIC ACID 500 MG PO TABS
500.0000 mg | ORAL_TABLET | Freq: Every day | ORAL | Status: DC
  Administered 2019-03-24 – 2019-03-26 (×3): 500 mg via ORAL
  Filled 2019-03-24: qty 2
  Filled 2019-03-24: qty 1
  Filled 2019-03-24: qty 2
  Filled 2019-03-24: qty 1

## 2019-03-24 MED ORDER — TETANUS-DIPHTH-ACELL PERTUSSIS 5-2.5-18.5 LF-MCG/0.5 IM SUSP
0.5000 mL | INTRAMUSCULAR | Status: DC | PRN
  Filled 2019-03-24 (×2): qty 0.5

## 2019-03-24 MED ORDER — DIPHENHYDRAMINE HCL 25 MG PO CAPS: 25.0000 mg | ORAL_CAPSULE | Freq: Four times a day (QID) | ORAL | Status: DC | PRN

## 2019-03-24 MED ORDER — ONDANSETRON HCL 4 MG PO TABS: 4.0000 mg | ORAL_TABLET | ORAL | Status: DC | PRN

## 2019-03-24 MED ORDER — WITCH HAZEL-GLYCERIN EX PADS: 1.0000 "application " | MEDICATED_PAD | CUTANEOUS | Status: DC | PRN

## 2019-03-24 MED ORDER — ACETAMINOPHEN 325 MG PO TABS: 650.0000 mg | ORAL_TABLET | ORAL | Status: DC | PRN

## 2019-03-24 MED ORDER — ONDANSETRON HCL 4 MG/2ML IJ SOLN: 4.0000 mg | INTRAMUSCULAR | Status: DC | PRN

## 2019-03-24 MED ORDER — ZINC SULFATE 220 (50 ZN) MG PO CAPS
220.0000 mg | ORAL_CAPSULE | Freq: Every day | ORAL | Status: DC
  Administered 2019-03-24 – 2019-03-26 (×3): 220 mg via ORAL
  Filled 2019-03-24 (×3): qty 1

## 2019-03-24 MED ORDER — ENOXAPARIN SODIUM 40 MG/0.4ML ~~LOC~~ SOLN
40.0000 mg | SUBCUTANEOUS | Status: DC
  Administered 2019-03-24 – 2019-03-26 (×3): 40 mg via SUBCUTANEOUS
  Filled 2019-03-24 (×3): qty 0.4

## 2019-03-24 MED ORDER — SENNOSIDES-DOCUSATE SODIUM 8.6-50 MG PO TABS
2.0000 | ORAL_TABLET | ORAL | Status: DC
  Administered 2019-03-24: 2 via ORAL
  Filled 2019-03-24 (×3): qty 2

## 2019-03-24 MED ORDER — SIMETHICONE 80 MG PO CHEW
80.0000 mg | CHEWABLE_TABLET | ORAL | Status: DC | PRN
  Filled 2019-03-24: qty 1

## 2019-03-24 MED ORDER — DIBUCAINE (PERIANAL) 1 % EX OINT: 1.0000 "application " | TOPICAL_OINTMENT | CUTANEOUS | Status: DC | PRN

## 2019-03-24 MED ORDER — PRENATAL MULTIVITAMIN CH
1.0000 | ORAL_TABLET | Freq: Every day | ORAL | Status: DC
  Administered 2019-03-24 – 2019-03-26 (×4): 1 via ORAL
  Filled 2019-03-24 (×3): qty 1

## 2019-03-24 MED ORDER — IBUPROFEN 200 MG PO TABS
600.0000 mg | ORAL_TABLET | Freq: Four times a day (QID) | ORAL | Status: DC
  Administered 2019-03-24 – 2019-03-26 (×10): 600 mg via ORAL
  Filled 2019-03-24 (×9): qty 3

## 2019-03-24 MED ORDER — DEXAMETHASONE SODIUM PHOSPHATE 10 MG/ML IJ SOLN
6.0000 mg | Freq: Every day | INTRAMUSCULAR | Status: DC
  Administered 2019-03-24 – 2019-03-26 (×3): 6 mg via INTRAVENOUS
  Filled 2019-03-24 (×2): qty 1
  Filled 2019-03-24 (×2): qty 0.6

## 2019-03-24 MED ORDER — ZOLPIDEM TARTRATE 5 MG PO TABS: 5.0000 mg | ORAL_TABLET | Freq: Every evening | ORAL | Status: DC | PRN

## 2019-03-24 MED ORDER — CHLORHEXIDINE GLUCONATE CLOTH 2 % EX PADS
6.0000 | MEDICATED_PAD | Freq: Every day | CUTANEOUS | Status: DC
  Administered 2019-03-24: 6 via TOPICAL

## 2019-03-24 MED ORDER — SODIUM CHLORIDE 0.9 % IV SOLN
100.0000 mg | Freq: Every day | INTRAVENOUS | Status: DC
  Administered 2019-03-24 – 2019-03-26 (×3): 100 mg via INTRAVENOUS
  Filled 2019-03-24: qty 100
  Filled 2019-03-24 (×2): qty 20

## 2019-03-24 MED ORDER — TETANUS-DIPHTH-ACELL PERTUSSIS 5-2.5-18.5 LF-MCG/0.5 IM SUSP
0.5000 mL | Freq: Once | INTRAMUSCULAR | Status: DC
  Filled 2019-03-24: qty 0.5

## 2019-03-24 MED ORDER — TRAMADOL HCL 50 MG PO TABS: 50.0000 mg | ORAL_TABLET | Freq: Four times a day (QID) | ORAL | Status: DC | PRN

## 2019-03-24 MED ORDER — COCONUT OIL OIL: 1.0000 "application " | TOPICAL_OIL | Status: DC | PRN

## 2019-03-24 MED ORDER — BENZOCAINE-MENTHOL 20-0.5 % EX AERO
1.0000 "application " | INHALATION_SPRAY | CUTANEOUS | Status: DC | PRN
  Filled 2019-03-24: qty 56

## 2019-03-24 NOTE — Progress Notes (Signed)
Patient was shown how to set up and use electric breast pump, and how to clean parts; also instructed to pump 8-10 x/24 hours to stimulate breasts and build milk supply. Patient stated she was tired and did not wish to initiate pumping at this time. Informed patient's nurse, Silva Bandy, of these instructions and patient's response.

## 2019-03-24 NOTE — Progress Notes (Addendum)
[place] Progress Note Patient Name: Anna Haynes DOB: [birth year] MRN: [id number]   Date of Service  03/24/2019  HPI/Events of Note  85 F 36 weeks AOG, COVID confirmed 12/10 underwent induction of labor due to worsening signs and symptoms of COVID requiring BiPap. Delivery uncomplicated.  eICU Interventions   Currently O2 sats 96% on 3 L [place] not in distress  Continue dexamethasone and Remdesevir     Intervention Category Major Interventions: Respiratory failure - evaluation and management Evaluation Type: New Patient Evaluation  Judd Lien 03/24/2019, 1:03 AM

## 2019-03-24 NOTE — Progress Notes (Signed)
Assisted tele visit to patient with husband.  Brittie Whisnant P, RN  

## 2019-03-24 NOTE — Progress Notes (Signed)
Post Partum Day 1 Subjective: tolerating PO and tolerating liquids. Patient reports feeling tired but breathing is improving.  Objective: Blood pressure 101/69, pulse 75, temperature 98.5 F (36.9 C), temperature source Oral, resp. rate (!) 28, height 5' (1.524 m), weight 71.7 kg, SpO2 95 %, unknown if currently breastfeeding.  Physical Exam:  General: alert, cooperative and appears stated age Lochia: appropriate Uterine Fundus: firm Incision: N/A DVT Evaluation: No evidence of DVT seen on physical exam. Negative Homan's sign. No cords or calf tenderness. No significant calf/ankle edema.  Recent Labs    03/23/19 1504 03/24/19 0352  HGB 13.5 12.7  HCT 40.1 37.5    Assessment/Plan: 33 yo G3P2103 PPD#1 s/p VAVD in the setting of IOL s/s worsening COVID. She has an overall uncomplicated delivery and from a postpartum standpoint is doing ok. Hgb appropriate. Lochia appropriate.   - continue fundal checks per RN - continue routine pp care - remainder of care per primary team in ICU   LOS: 1 day   Tyson Dense 03/24/2019, 8:55 AM

## 2019-03-24 NOTE — Progress Notes (Signed)
I have called OB specialty and Lactation consultant for Breast feeding assistance

## 2019-03-24 NOTE — Discharge Summary (Signed)
[facility] Discharge Summary  Patient name: Anna Haynes Medical record number: [id number] Date of birth: 12/04/1985 Age: 33 y.o. Gender: female Date of Admission: 03/22/2019  Date of Discharge: 03/26/2019 Admitting Physician: Anna Cleverly, MD  Primary Care Provider: Linda Hedges, DO Consultants: CCM  Indication for Hospitalization:  Hypoxemic respiratory failure 2/2 COVID Pregnancy Postpartum   Discharge Diagnoses/Problem List:  Hypoxemic respiratory failure 2/2 COVID  Disposition: Home   Discharge Condition: Medically stable for discharge   Discharge Exam:  General: Alert, cooperative, looks alert and less tired than yesterday Cardio: Normal S1 and S2, RRR. No murmurs or rubs.   Pulm: Bilateral crackles and improved air entry compared to yesterday's examination.  No wheezing.  Normal work of breathing Abdomen: Bowel sounds normal. Abdomen soft and non-tender.  Extremities: No peripheral edema. Warm/ well perfused.  Strong radial pulse Neuro: Cranial nerves grossly intact  Brief Hospital Course:  Anna Haynes is a 33 y.o. [phone] female presenting with SOB 2/2 COVID. At time of admission patient was pregnancy at [redacted]w[redacted]d.   COVID positive requiring O2 On the date of admission patient was tachypneic and tachycardic.  Also requiring supplemental oxygen use. Had increased difficulty breathing and was admitted straight to the intensive care unit.  Patient had elevated markers including ferritin, LDH, and pH was 7.33.  Patient was started on dexamethasone and remdesivir on date of admission (12/13).  BiPAP was initiated however patient refused.  Patient was transferred out of the ICU on 12/14 and was recommended to continue remdesivir, dexamethasone, vitamin C, zinc. Pt continued to require oxygen during her hospital stay. Her condition improved and she was weaned off oxygen. WBC 7.8 and CRP 1.4 on discharge.  Abnormal LFTs ALP, ALT, AST and Bili  were elevated during admission, likely 2/2 Remdesivir use. They were monitored daily and trended downwards. On discharge ALP 130, ALT 48, AST 65 and Bili 0.6.  Pregnancy  At time of admission patient was pregnant at [redacted]w[redacted]d.  OB was consulted during admission as well as MFM.  Due to worsening Covid, delivery was recommended for further fetal benefit.  Labor was induced on 12/13.  Kiwi had to be used during patient delivery due to difficulty in pushing secondary to coughing and effort. There were no other acute obstetrics issues during delivery. Pt was expressing breast milk and also bottle feeding her baby, however the baby was kept in the nursery.   Pt's vitals were normal and medically stable on discharge.   Issues for Follow Up:  1. F/u with OB for postpartum visit 2. F/U with PCP   Significant Procedures:   Significant Labs and Imaging:  Recent Labs  Lab 03/24/19 0352 03/25/19 0500 03/26/19 0500  WBC 7.7 8.1 7.8  HGB 12.7 12.9 13.0  HCT 37.5 38.9 39.4  PLT 217 287 350   Recent Labs  Lab 03/22/19 2235 03/23/19 0250 03/23/19 0526 03/23/19 1504 03/24/19 0352 03/25/19 0500 03/26/19 0500  NA 134* 134* 135 137  --  140 145  K 3.8 3.7 3.7 4.3  --  3.9 3.8  CL 106  --  108 110  --  110 112*  CO2 12*  --  12* 12*  --  19* 20*  GLUCOSE 79  --  77 103*  --  81 71  BUN 6  --  5* 7  --  13 18  CREATININE 0.83  --  0.98 0.69 0.59 0.77 0.70  CALCIUM 8.6*  --  8.7* 8.9  --  8.5* 8.6*  MG  --   --   --   --   --  2.0 2.0  ALKPHOS 158*  --  172* 175*  --  140* 130*  AST 89*  --  91* 96*  --  70* 65*  ALT 56*  --  54* 61*  --  48* 48*  ALBUMIN 2.5*  --  2.3* 2.4*  --  2.0* 2.0*      Results/Tests Pending at Time of Discharge:   Discharge Medications:  Allergies as of 03/26/2019      Reactions   Amoxicillin Hives   Codeine Hives      Medication List    TAKE these medications   acetaminophen 325 MG tablet Commonly known as: TYLENOL Take 650 mg by mouth every 6 (six)  hours as needed for mild pain or fever.   dexamethasone 6 MG tablet Commonly known as: DECADRON Take 1 tablet (6 mg total) by mouth daily with breakfast for 6 days.   dextromethorphan 30 MG/5ML liquid Commonly known as: DELSYM Take 2.5 mLs (15 mg total) by mouth daily as needed for cough.   prenatal multivitamin Tabs tablet Take 1 tablet by mouth daily at 12 noon.       Discharge Instructions: Please refer to Patient Instructions section of EMR for full details.  Patient was counseled important signs and symptoms that should prompt return to medical care, changes in medications, dietary instructions, activity restrictions, and follow up appointments.   Follow-Up Appointments: Follow-up Information    Morris, Aundra Millet, DO. Schedule an appointment as soon as possible for a visit in 1 week(s).   Specialty: Obstetrics and Gynecology Contact information: [address] n [address] [place] [address] [phone]           Anna Octave, MD 03/28/2019, 3:05 PM PGY-1, [facility]

## 2019-03-24 NOTE — Progress Notes (Signed)
CSW consulted as infant remains in Nursery while MOB on inpatient unit. CSW actively working with inpatient social worker to establish further plana for discharging infant once medically appropriate.        Anna Haynes S. Anna Haynes, MSW, LCSW [facility] [place] [phone]  

## 2019-03-24 NOTE — Progress Notes (Signed)
I have gotten breast feeding pump from Pediatrics however the patient now has decided not to pump

## 2019-03-24 NOTE — Progress Notes (Signed)
FPTS has received sign out from ICU for patient.  We will be happy to assume care for this patient on 12/15 at 0700.  Appreciate the excellent care provided by CCM and OB/GYN.  Arizona Constable, D.O.  PGY-2 Family Medicine  03/24/2019 11:31 AM

## 2019-03-24 NOTE — Anesthesia Postprocedure Evaluation (Signed)
Anesthesia Post Note  Patient: Anna Haynes  Procedure(s) Performed: AN AD [place]     Patient location during evaluation: Mother Baby Anesthesia Type: Epidural Level of consciousness: awake Pain management: satisfactory to patient Vital Signs Assessment: post-procedure vital signs reviewed and stable Respiratory status: spontaneous breathing Cardiovascular status: stable Anesthetic complications: no    Last Vitals:  Vitals:   03/24/19 0800 03/24/19 0900  BP: 101/69 97/80  Pulse: 75 82  Resp: (!) 28 (!) 28  Temp:    SpO2: 95% 95%    Last Pain:  Vitals:   03/24/19 0800  TempSrc:   PainSc: 1    Pain Goal:  Report via [place], antenatal RN who assessed the patient on 36M                 [facility]

## 2019-03-24 NOTE — Lactation Note (Signed)
This note was copied from a baby's chart. Lactation Consultation Note  Patient Name: Anna Haynes ALPFX'T Date: 03/24/2019 Reason for consult: Initial assessment;Late-preterm 34-36.6wks;Infant < 6lbs  Specialty care called twice regarding COVID (+) mom in the ICU; baby is 44 hours old LPI < 6 lbs, mom was offered to pumped yesterday but she refused, Specialty care secretary called [facility] asking for a pump, pump supplies and LC consult for pump-set up.  [facility] called Specialty care and let them know that we can no longer lend pumps, they need to contact portable equipment for such needs. Once ICU RN got the pump she requested [facility] consult, LC went to the room and after donning on full PPE prior entering the room, ICU RN told LC that mom has changed her mind again and that she's not going to do any pumping; this is her third baby.  LC let her know that we'll document the encounter in case mom changes her mind again. Baby is currently on Similac 22 and being 100% formula fed, mom came as breast/formula, but she has decided for the latter. [facility] services no longer needed at this point.  Maternal Data Formula Feeding for Exclusion: Yes Reason for exclusion: Admission to Intensive Care Unit (ICU) post-partum Has patient been taught Hand Expression?: No  Feeding Feeding Type: Bottle Fed - Formula Nipple Type: Slow - flow  LATCH Score                   Interventions    Lactation Tools Discussed/Used     Consult Status Consult Status: Complete    Marchella Hibbard S Vinnie Bobst 03/24/2019, 5:07 PM

## 2019-03-24 NOTE — Progress Notes (Signed)
Minimal vaginal bleeding noted denies pain. Resting quietly with eyes closed.

## 2019-03-24 NOTE — Progress Notes (Addendum)
NAME:  Anna Haynes, MRN:  [id number], DOB:  1986-01-15, LOS: 1 ADMISSION DATE:  03/22/2019, CONSULTATION DATE:  03/23/2019 REFERRING MD:  FM teaching Service, CHIEF COMPLAINT:  dyspnea  Brief History   33 year old female diagnosed with Covid 19 12/10 in her third trimester presents to the emergency room with worsening dyspnea.  History of present illness   Patient is a 33 year old female who tells me that she has had increasing difficulty with shortness of breath worse today and yesterday prompting repeat visit to the emergency room.  She was tested for Covid on the 10th and found to be positive.  On evaluation here she has right-sided infiltrates, is tachycardic with pulse over 100 mildly elevated liver function tests LDH of 330 with a ferritin of 600 CRP of 9.1.  Also has a bicarbonate of 12 with anion gap of 16.  She is mildly acidotic by arterial blood gas with a pH of 7.3 PCO2 of 20 PO2 of 75 O2 saturation over 90.  She reports a low-grade fever.  She also has had contractions although OB does not believe that she is urgently going to deliver, she is fingertip dilated and being fetal monitor.    Past Medical History  History reviewed. No pertinent past medical history.    Significant Hospital Events   Admission 03/23/2019 Vaginal delivery 12/13  Consults:  OB, FP, PCCM  Procedures:  NA  Significant Diagnostic Tests:  As above  Micro Data:  Covid positive  Antimicrobials/COVID 19 Tx  Azithromycin 12/09 Clindamycin 12/13 GBStrep prophylaxis for delivery  Remdesivir 12/12>> Decadron 12/12>>  Interim history/subjective:  Spontaneous vaginal delivery at 9:14pm.  On 3lpm HFNC. Denies SOB or difficulty breathing.  VSS. Able to rest /p delivery.   Objective   Blood pressure 98/67, pulse 90, temperature 98.5 F (36.9 C), temperature source Oral, resp. rate (!) 32, height 5' (1.524 m), weight 71.7 kg, SpO2 95 %, unknown if currently breastfeeding.        Intake/Output  Summary (Last 24 hours) at 03/24/2019 0736 Last data filed at 03/23/2019 2303 Gross per 24 hour  Intake 1030.73 ml  Output 925 ml  Net 105.73 ml   Filed Weights   03/22/19 2224 03/23/19 1215  Weight: 71.7 kg 71.7 kg    Examination: General: Well-developed, well nourished. NAD HENT: Normocephalic, PERRL. Moist mucus membranes Neck: No JVD. Trachea midline.  CV: RRR. S1S2. No MRG. +2 distal pulses Lungs: BBS present, clear, FNL, symmetrical ABD: +BS x4. SNT/ND. No masses, guarding or rigidity GU: No Foley.Lochia as expected.  EXT: MAE well. No edema Skin: PWD. In tact. No rashes or lesions Neuro: A&Ox3. CN II-XII in tact. No focal deficits Psych: Appropriate mood, insight and judgment for time and situation    Resolved Hospital Problem list   NA  Assessment & Plan:   Severe Covid infection: Weaned to 3lpm HFNC. Appears comfortable.  Plan: Continue remdesivir, dexamethasone, supportive care. Wean FiO2 for SpO2 >/=88%. Follow fever curve, biomarkers  Add vitamin C and Zinc   2. S/P vaginal delivery: OB following      Best practice:  Diet: regular diet  Pain/Anxiety/Delirium protocol (if indicated): prn analgesia  VAP protocol (if indicated): N/A DVT prophylaxis: enoxaparin, SCDs GI prophylaxis: N/A Glucose control: monitor with BMP  Mobility: as tolerated  Code Status: Full Family Communication: Will update. Pt also able to speak to family by her cell phone in room  Disposition: pt may transfer to 5W. Will S/O to FMTS.   Labs  CBC: Recent Labs  Lab 03/20/19 0152 03/22/19 2235 03/23/19 0250 03/23/19 0526 03/23/19 1504 03/24/19 0352  WBC 5.9 7.5  --  8.4 7.5 7.7  NEUTROABS 4.6 5.9  --   --   --   --   HGB 13.3 13.1 11.9* 12.9 13.5 12.7  HCT 39.6 41.1 35.0* 39.9 40.1 37.5  MCV 82.5 85.1  --  84.7 82.2 81.3  PLT 187 218  --  226 256 217    Basic Metabolic Panel: Recent Labs  Lab 03/20/19 0152 03/22/19 2235 03/23/19 0250 03/23/19 0526  03/23/19 1504 03/24/19 0352  NA 137 134* 134* 135 137  --   K 3.8 3.8 3.7 3.7 4.3  --   CL 106 106  --  108 110  --   CO2 20* 12*  --  12* 12*  --   GLUCOSE 84 79  --  77 103*  --   BUN 6 6  --  5* 7  --   CREATININE 0.58 0.83  --  0.98 0.69 0.59  CALCIUM 8.4* 8.6*  --  8.7* 8.9  --    GFR: Estimated Creatinine Clearance: 87.6 mL/min (by C-G formula based on SCr of 0.59 mg/dL). Recent Labs  Lab 03/22/19 2235 03/23/19 0010 03/23/19 0526 03/23/19 1504 03/24/19 0352  PROCALCITON 0.12  --   --   --   --   WBC 7.5  --  8.4 7.5 7.7  LATICACIDVEN  --  1.2  --   --   --     Liver Function Tests: Recent Labs  Lab 03/22/19 2235 03/23/19 0526 03/23/19 1504  AST 89* 91* 96*  ALT 56* 54* 61*  ALKPHOS 158* 172* 175*  BILITOT 1.9* 1.5* 1.6*  PROT 5.9* 6.0* 5.9*  ALBUMIN 2.5* 2.3* 2.4*   PCCM will sign off. Thank you for the opportunity to participate in this patient's care. Please contact if we can be of further assistance.   Karin Lieu, MSN, AGACNP  [facility]

## 2019-03-25 DIAGNOSIS — J9601 Acute respiratory failure with hypoxia: Secondary | ICD-10-CM

## 2019-03-25 DIAGNOSIS — J1289 Other viral pneumonia: Secondary | ICD-10-CM

## 2019-03-25 LAB — CBC
HCT: 38.9 % (ref 36.0–46.0)
Hemoglobin: 12.9 g/dL (ref 12.0–15.0)
MCH: 27.1 pg (ref 26.0–34.0)
MCHC: 33.2 g/dL (ref 30.0–36.0)
MCV: 81.7 fL (ref 80.0–100.0)
Platelets: 287 10*3/uL (ref 150–400)
RBC: 4.76 MIL/uL (ref 3.87–5.11)
RDW: 13.3 % (ref 11.5–15.5)
WBC: 8.1 10*3/uL (ref 4.0–10.5)
nRBC: 0.4 % — ABNORMAL HIGH (ref 0.0–0.2)

## 2019-03-25 LAB — FERRITIN: Ferritin: 556 ng/mL — ABNORMAL HIGH (ref 11–307)

## 2019-03-25 LAB — BASIC METABOLIC PANEL
Anion gap: 11 (ref 5–15)
BUN: 13 mg/dL (ref 6–20)
CO2: 19 mmol/L — ABNORMAL LOW (ref 22–32)
Calcium: 8.5 mg/dL — ABNORMAL LOW (ref 8.9–10.3)
Chloride: 110 mmol/L (ref 98–111)
Creatinine, Ser: 0.77 mg/dL (ref 0.44–1.00)
GFR calc Af Amer: >60 mL/min (ref >60)
GFR calc non Af Amer: >60 mL/min (ref >60)
Glucose, Bld: 81 mg/dL (ref 70–99)
Potassium: 3.9 mmol/L (ref 3.5–5.1)
Sodium: 140 mmol/L (ref 135–145)

## 2019-03-25 LAB — HEPATIC FUNCTION PANEL
ALT: 48 U/L — ABNORMAL HIGH (ref 0–44)
AST: 70 U/L — ABNORMAL HIGH (ref 15–41)
Albumin: 2 g/dL — ABNORMAL LOW (ref 3.5–5.0)
Alkaline Phosphatase: 140 U/L — ABNORMAL HIGH (ref 38–126)
Bilirubin, Direct: 0.6 mg/dL — ABNORMAL HIGH (ref 0.0–0.2)
Indirect Bilirubin: 0.5 mg/dL (ref 0.3–0.9)
Total Bilirubin: 1.1 mg/dL (ref 0.3–1.2)
Total Protein: 5 g/dL — ABNORMAL LOW (ref 6.5–8.1)

## 2019-03-25 LAB — MAGNESIUM: Magnesium: 2 mg/dL (ref 1.7–2.4)

## 2019-03-25 LAB — CULTURE, BETA STREP (GROUP B ONLY)

## 2019-03-25 LAB — LACTATE DEHYDROGENASE: LDH: 328 U/L — ABNORMAL HIGH (ref 98–192)

## 2019-03-25 LAB — GLUCOSE, CAPILLARY
Glucose-Capillary: 91 mg/dL (ref 70–99)
Glucose-Capillary: 122 mg/dL — ABNORMAL HIGH (ref 70–99)

## 2019-03-25 LAB — C-REACTIVE PROTEIN: CRP: 4.5 mg/dL — ABNORMAL HIGH (ref <1.0)

## 2019-03-25 NOTE — Progress Notes (Signed)
Post-Partum check:  U/1, uterus firm and contracted.  Scant vaginal bleeding.  Patient denies postpartum pain.

## 2019-03-25 NOTE — Progress Notes (Addendum)
Family Medicine Teaching Service Daily Progress Note Intern Pager: [phone]  Patient name: Anna Haynes Medical record number: [id number] Date of birth: 1985/10/01 Age: 33 y.o. Gender: female  Primary Care Provider: Linda Hedges, DO Consultants: CCM Code Status: Full   Pt Overview and Major Events to Date:  12/10: Admitted  12/15: Transfer to FPTS  Assessment and Plan:  Hypoxemic respiratory failure 2/2 COVID-treating Pt feels weak which she has felt since her admission. SOB has improved very slightly. Denies fevers or chest pain. Coughing on inspiration. On examination: poor AE bilaterally. No crackles or wheezing.  Sats 92-93% on 3L oxygen, BP 106/74, HR 65, RR 34, T 97.7 WBC 8.1 today, CRP 4.5 (9.6 on 12/13), LDH 328 (341 on 12/13), ferrtin 556 (620 on 12/13) Stepdown from ICU today. COVID positive on 12/10. Day 3 Remsedivr (12/13-) & Day 3 Dexamethasone (12/13-) -Vital per floor routine -Maintain sats >95%  -Monitor respiratory status -Wean oxygen as able   -Continue Remdesivir 5 days total and dexamethasone 10 days total  -PT/OT -Ambulate with pulse oximetry  -Continue vitamin C and zinc -Consider CXR if symptoms not improving  Abnormal LFTs likely secondary to Remdesevir-resolving AST 70 today (96 on 12/13), ALT 48 (61 on 12/13), Total bili 1.1 (1.6 on 12/13), ALP 140 (175 on 12/15)  -Monitor LFTs with daily CMP  Hypocalcemia 8.5 today, 8.9 on 12/13 -Continue to monitor Ca  Post partum/pregnancy [id number], PPD#2, VAVD (Kiwi) in setting of IOL (2/2 worsening symptoms of COVID requiring BiPAP) at [redacted]w[redacted]d no acute obstetrical issues. Baby remains in nursery. Hb 12.9 today, 12.7 on 12/14 stable  Breast and bottle feeding -Ob following, appreciate recs -Lactation for breast feeding assistance  -Fundal checks per OB -Monitor Hb   FEN/GI: Normal diet  PPx: Lovenox   Post rounds addendum Daily CBC, CMP, CRP Speak with peds regarding pt being able to see her baby  given that her partner also tested positive for COVID and per notes will be allowed to stay with father.   Disposition: home pending resolution of COVID   Subjective:  Pt feels weak which she has felt since her admission. SOB has improved very slightly. Denies fevers or chest pain. Coughing on inspiration. Able to tolerate PO, encouraged patient to keep adequate oral intake. Pt able to ambulate to bathroom and back to her bed.  Objective: Temp:  [97.1 F (36.2 C)-97.7 F (36.5 C)] 97.7 F (36.5 C) (12/15 0757) Pulse Rate:  [61-90] 75 (12/15 0900) Resp:  [2-34] 19 (12/15 0900) BP: (90-106)/(68-83) 96/81 (12/15 0900) SpO2:  [92 %-98 %] 92 % (12/15 0900)  General: Alert, cooperative, unwell appearing  Cardio: Normal S1 and S2, RRR. No murmurs or rubs.   Pulm: Reduced AE bilaterally, no crackles or wheeze. Normal respiratory effort Abdomen: Hypoactive bowel sounds. Abdomen soft, generalized tenderness. No guarding. Fundus size appropriate and firm. Extremities: No peripheral edema. Warm/ well perfused.  Strong radial pulse Neuro: Cranial nerves grossly intact  Laboratory: Recent Labs  Lab 03/23/19 1504 03/24/19 0352 03/25/19 0500  WBC 7.5 7.7 8.1  HGB 13.5 12.7 12.9  HCT 40.1 37.5 38.9  PLT 256 217 287   Recent Labs  Lab 03/23/19 0526 03/23/19 1504 03/24/19 0352 03/25/19 0500  NA 135 137  --  140  K 3.7 4.3  --  3.9  CL 108 110  --  110  CO2 12* 12*  --  19*  BUN 5* 7  --  13  CREATININE 0.98 0.69 0.59 0.77  CALCIUM  8.7* 8.9  --  8.5*  PROT 6.0* 5.9*  --  5.0*  BILITOT 1.5* 1.6*  --  1.1  ALKPHOS 172* 175*  --  140*  ALT 54* 61*  --  48*  AST 91* 96*  --  70*  GLUCOSE 77 103*  --  81     Imaging/Diagnostic Tests: No results found.  [place]anda Octave, MD 03/25/2019, 9:37 AM PGY-1, [facility] FPTS Intern pager: [phone], text pages welcome

## 2019-03-25 NOTE — Progress Notes (Signed)
I have called and updated York Cerise ( Patient's husband)

## 2019-03-26 LAB — COMPREHENSIVE METABOLIC PANEL
ALT: 48 U/L — ABNORMAL HIGH (ref 0–44)
AST: 65 U/L — ABNORMAL HIGH (ref 15–41)
Albumin: 2 g/dL — ABNORMAL LOW (ref 3.5–5.0)
Alkaline Phosphatase: 130 U/L — ABNORMAL HIGH (ref 38–126)
Anion gap: 13 (ref 5–15)
BUN: 18 mg/dL (ref 6–20)
CO2: 20 mmol/L — ABNORMAL LOW (ref 22–32)
Calcium: 8.6 mg/dL — ABNORMAL LOW (ref 8.9–10.3)
Chloride: 112 mmol/L — ABNORMAL HIGH (ref 98–111)
Creatinine, Ser: 0.7 mg/dL (ref 0.44–1.00)
GFR calc Af Amer: >60 mL/min (ref >60)
GFR calc non Af Amer: >60 mL/min (ref >60)
Glucose, Bld: 71 mg/dL (ref 70–99)
Potassium: 3.8 mmol/L (ref 3.5–5.1)
Sodium: 145 mmol/L (ref 135–145)
Total Bilirubin: 0.6 mg/dL (ref 0.3–1.2)
Total Protein: 5 g/dL — ABNORMAL LOW (ref 6.5–8.1)

## 2019-03-26 LAB — CBC
HCT: 39.4 % (ref 36.0–46.0)
Hemoglobin: 13 g/dL (ref 12.0–15.0)
MCH: 26.9 pg (ref 26.0–34.0)
MCHC: 33 g/dL (ref 30.0–36.0)
MCV: 81.6 fL (ref 80.0–100.0)
Platelets: 350 10*3/uL (ref 150–400)
RBC: 4.83 MIL/uL (ref 3.87–5.11)
RDW: 13.3 % (ref 11.5–15.5)
WBC: 7.8 10*3/uL (ref 4.0–10.5)
nRBC: 0.4 % — ABNORMAL HIGH (ref 0.0–0.2)

## 2019-03-26 LAB — C-REACTIVE PROTEIN: CRP: 1.4 mg/dL — ABNORMAL HIGH (ref <1.0)

## 2019-03-26 LAB — MAGNESIUM: Magnesium: 2 mg/dL (ref 1.7–2.4)

## 2019-03-26 LAB — SURGICAL PATHOLOGY

## 2019-03-26 MED ORDER — DEXAMETHASONE 6 MG PO TABS: 6.0000 mg | ORAL_TABLET | Freq: Two times a day (BID) | ORAL | 0 refills | Status: DC

## 2019-03-26 MED ORDER — DEXAMETHASONE 6 MG PO TABS: 6.0000 mg | ORAL_TABLET | Freq: Every day | ORAL | 0 refills | Status: AC

## 2019-03-26 NOTE — Progress Notes (Signed)
Patient ambulated around the unit twice , Sats were 93-94 prior and 91-93 during ambulation and remains 93% after . She denied dyspnea but did have a nonproductive cough afterward

## 2019-03-26 NOTE — Progress Notes (Signed)
Discharge instructions given to patient for prescriptions and return appt and vaginal delivery postpartum instructions

## 2019-03-26 NOTE — Progress Notes (Addendum)
Family Medicine Teaching Service Daily Progress Note Intern Pager: [phone]  Patient name: Anna Haynes Medical record number: [id number] Date of birth: [birth year] Age: 33 y.o. Gender: female  Primary Care Provider: Mitchel Honour, DO Consultants: CCM Code Status: Full   Pt Overview and Major Events to Date:  12/10: Admitted  12/15: Transfer to FPTS  Assessment and Plan:  Hypoxemic respiratory failure 2/2 COVID-improving  Feels better today compared to yesterday, shortness of breath is improving, denies chest pain. Sats 95% on air today (cons per notes only desats when walking to bathroom), BP 90-100s systolic, HR 66, QB34 , T97.7 WBC 7.8 today, stable, CRP 1.4 (4.5 on 12/15) COVID positive on 12/10. Stepdown from ICU on 12/15. Day 4 Remsedivr (12/13-) & Day 4 Dexamethasone (12/13-) -Vital per floor routine -Maintain sats >95%  -Monitor respiratory status -Wean oxygen as able   -Continue Remdesivir 5 days total and dexamethasone 10 days total  -PT/OT -Ambulate with pulse oximetry and if does not desat can be discharged today. If desats then pt should remain in hospital today  -Continue vitamin C and zinc  Abnormal LFTs likely secondary to Remdesevir-resolving AST 65, (70 on 12/15), ALT 48 (48 on 12/16), Total bili 0.6 (1.1 on 12/15), ALP 130 (140 on 12/15)  -Monitor LFTs with daily CMP  Post partum/pregnancy [id number], PPD#3, VAVD (Kiwi) in setting of IOL (2/2 worsening symptoms of COVID requiring BiPAP) at [redacted]w[redacted]d no acute obstetrical issues. Baby remains in nursery. Hb 13, stable Breast and bottle feeding -Ob following, appreciate recs, signed off on 12/15  FEN/GI: Normal diet  PPx: Lovenox   Disposition: home pending resolution of COVID   Subjective:  Feels better today compared to yesterday, shortness of breath is improving, denies chest pain or fevers overnight.  He is into minds about going home, like to go home but is unsure whether she is ready.  Patient says she has  poked herself to the bathroom.  Baby is going home today with her husband and plans to FaceTime them later.  Objective: Temp:  [97.7 F (36.5 C)-97.9 F (36.6 C)] 97.9 F (36.6 C) (12/16 0400) Pulse Rate:  [64-80] 66 (12/16 0500) Resp:  [11-30] 23 (12/16 0500) BP: (90-102)/(69-83) 97/78 (12/16 0500) SpO2:  [92 %-99 %] 95 % (12/16 0500)  General: Alert, cooperative, looks alert and less tired than yesterday Cardio: Normal S1 and S2, RRR. No murmurs or rubs.   Pulm: Bilateral crackles and improved air entry compared to yesterday's examination.  No wheezing.  Normal work of breathing Abdomen: Bowel sounds normal. Abdomen soft and non-tender.  Extremities: No peripheral edema. Warm/ well perfused.  Strong radial pulse Neuro: Cranial nerves grossly intact  Laboratory: Recent Labs  Lab 03/24/19 0352 03/25/19 0500 03/26/19 0500  WBC 7.7 8.1 7.8  HGB 12.7 12.9 13.0  HCT 37.5 38.9 39.4  PLT 217 287 350   Recent Labs  Lab 03/23/19 1504 03/24/19 0352 03/25/19 0500 03/26/19 0500  NA 137  --  140 145  K 4.3  --  3.9 3.8  CL 110  --  110 112*  CO2 12*  --  19* 20*  BUN 7  --  13 18  CREATININE 0.69 0.59 0.77 0.70  CALCIUM 8.9  --  8.5* 8.6*  PROT 5.9*  --  5.0* 5.0*  BILITOT 1.6*  --  1.1 0.6  ALKPHOS 175*  --  140* 130*  ALT 61*  --  48* 48*  AST 96*  --  70* 65*  GLUCOSE  103*  --  81 71     Imaging/Diagnostic Tests: No results found.  Anna Haw, MD 03/26/2019, 6:33 AM PGY-1, [place] Intern pager: [phone], text pages welcome  I saw and examined this patient.  I discussed with the full resident team in rounds.  I agree with the documentation and management of Dr. Posey Haynes.  Patient maintaining O2 sats off O2 and at rest.  Will ambulate.  If maintains O2 sats with ambulation, likely DC today.

## 2019-03-26 NOTE — Progress Notes (Signed)
Orthopedic Tech Progress Note Patient Details:  Anna Haynes 02-01-1986 [id number] Was called by the secretary requesting an ABDOMINAL BINDER for patient Ortho Devices Type of Ortho Device: Abdominal binder Ortho Device/Splint Location: stomach Ortho Device/Splint Interventions: Application, Ordered   Post Interventions Patient Tolerated: Well Instructions Provided: Care of device, Adjustment of device   Janit Pagan 03/26/2019, 3:27 PM

## 2019-03-26 NOTE — Progress Notes (Signed)
SATURATION QUALIFICATIONS: (This note is used to comply with regulatory documentation for home oxygen)  Patient Saturations on Room Air at Rest = 94%  Patient Saturations on Room Air while Ambulating = 91-93%%  Patient Saturations on 0 Liters of oxygen while Ambulating =   Please briefly explain why patient needs home oxygen:Patient did not fall below 91% on Room Air while Ambulating

## 2019-03-26 NOTE — Progress Notes (Signed)
Patient discharged to home via wheelchair.

## 2019-03-26 NOTE — Progress Notes (Signed)
FPTS Interim Progress Note  Called Dr. Helane Rima, physician on-call for [facility], this patient's [facility] provider.  Discussed that attending was still listed as a physician from her practice but no not from yesterday and FPTS is hoping to discharge patient tomorrow pending O2 requirement with ambulation.  Dr. Helane Rima reported that Dr. Royston Sinner had advised her that patient was stable for discharge from [facility] standpoint and that it is appropriate for attending change to [facility] attending.    Attending changed.  Appreciate the OB care provided for this patient.  She will have regular post-partum follow up with OB.  No OB barriers to d/c.   [place], DO 03/26/2019, 11:43 AM PGY-2, [place] Service pager [phone]

## 2019-03-26 NOTE — Discharge Instructions (Addendum)
Please make follow up appointment with Obstetrics Gynecologist and also make follow up appointment with Primary care physician  ONLY TAKE DEXAMETHASONE ONCE DAILY!

## 2019-03-27 LAB — CULTURE, BLOOD (ROUTINE X 2): Culture: NO GROWTH

## 2019-03-28 LAB — CULTURE, BLOOD (ROUTINE X 2): Culture: NO GROWTH

## 2019-04-17 ENCOUNTER — Inpatient Hospital Stay (HOSPITAL_COMMUNITY): Admission: AD | Admit: 2019-04-17 | Payer: 59 | Source: Home / Self Care | Admitting: Obstetrics & Gynecology

## 2019-04-18 ENCOUNTER — Institutional Professional Consult (permissible substitution): Payer: 59 | Admitting: Pulmonary Disease

## 2019-04-30 ENCOUNTER — Other Ambulatory Visit: Payer: Self-pay

## 2019-04-30 ENCOUNTER — Encounter: Payer: Self-pay | Admitting: Pulmonary Disease

## 2019-04-30 ENCOUNTER — Ambulatory Visit: Payer: 59 | Admitting: Pulmonary Disease

## 2019-04-30 VITALS — BP 92/60 | HR 67 | Temp 97.4°F | Ht 60.0 in | Wt 139.4 lb

## 2019-04-30 DIAGNOSIS — U071 COVID-19: Secondary | ICD-10-CM | POA: Diagnosis not present

## 2019-04-30 DIAGNOSIS — J1282 Pneumonia due to coronavirus disease 2019: Secondary | ICD-10-CM | POA: Diagnosis not present

## 2019-04-30 DIAGNOSIS — R0602 Shortness of breath: Secondary | ICD-10-CM

## 2019-04-30 NOTE — Progress Notes (Signed)
Anna Haynes    [id number]    12/10/1985  Primary Care Physician:Morris, Jinny Blossom, DO  Referring Physician: Linda Hedges, [address], [place], [place],  [place] [address]  Chief complaint: Follow-up for post COVID-19 pneumonia  HPI: 34 year old with no significant past medical history.  She was diagnosed with COVID-19 pneumonia and hospitalized from 03/22/19 to 03/26/19. At the time she was [redacted] weeks pregnant.  Admitted to the ICU and treated with remdesivir, dexamethasone.  Due to her respiratory status labor was induced on 12/13 and the baby delivered without any issue  She is doing well post discharge.  Feels that her breathing is back to normal.  Able to exert herself and go up stairs without any issue.  She is not on any supplemental oxygen at present.  Pets: No pets Occupation: Works as a Education administrator at [employer]: No known exposures.  No mold, hot tub, Smoking history: Never smoker Travel history: No significant travel history Relevant family history: No significant family history of lung disease  Outpatient Encounter Medications as of 04/30/2019  Medication Sig  . acetaminophen (TYLENOL) 325 MG tablet Take 650 mg by mouth every 6 (six) hours as needed for mild pain or fever.  . Prenatal Vit-Fe Fumarate-FA (PRENATAL MULTIVITAMIN) TABS tablet Take 1 tablet by mouth daily at 12 noon.  . [DISCONTINUED] dextromethorphan (DELSYM) 30 MG/5ML liquid Take 2.5 mLs (15 mg total) by mouth daily as needed for cough.   No facility-administered encounter medications on file as of 04/30/2019.    Allergies as of 04/30/2019 - Review Complete 04/30/2019  Allergen Reaction Noted  . Amoxicillin Hives 03/20/2019  . Codeine Hives 03/20/2019    History reviewed. No pertinent past medical history.  History reviewed. No pertinent surgical history.  History reviewed. No pertinent family history.  Social History   Socioeconomic  History  . Marital status: Married    Spouse name: Not on file  . Number of children: Not on file  . Years of education: Not on file  . Highest education level: Not on file  Occupational History  . Not on file  Tobacco Use  . Smoking status: Never Smoker  . Smokeless tobacco: Never Used  Substance and Sexual Activity  . Alcohol use: Never  . Drug use: Never  . Sexual activity: Never  Other Topics Concern  . Not on file  Social History Narrative  . Not on file   Social Determinants of Health   Financial Resource Strain:   . Difficulty of Paying Living Expenses: Not on file  Food Insecurity:   . Worried About Charity fundraiser in the Last Year: Not on file  . Ran Out of Food in the Last Year: Not on file  Transportation Needs:   . Lack of Transportation (Medical): Not on file  . Lack of Transportation (Non-Medical): Not on file  Physical Activity:   . Days of Exercise per Week: Not on file  . Minutes of Exercise per Session: Not on file  Stress:   . Feeling of Stress : Not on file  Social Connections:   . Frequency of Communication with Friends and Family: Not on file  . Frequency of Social Gatherings with Friends and Family: Not on file  . Attends Religious Services: Not on file  . Active Member of Clubs or Organizations: Not on file  . Attends Archivist Meetings: Not on file  .  Marital Status: Not on file  Intimate Partner Violence:   . Fear of Current or Ex-Partner: Not on file  . Emotionally Abused: Not on file  . Physically Abused: Not on file  . Sexually Abused: Not on file    Review of systems: Review of Systems  Constitutional: Negative for fever and chills.  HENT: Negative.   Eyes: Negative for blurred vision.  Respiratory: as per HPI  Cardiovascular: Negative for chest pain and palpitations.  Gastrointestinal: Negative for vomiting, diarrhea, blood per rectum. Genitourinary: Negative for dysuria, urgency, frequency and hematuria.   Musculoskeletal: Negative for myalgias, back pain and joint pain.  Skin: Negative for itching and rash.  Neurological: Negative for dizziness, tremors, focal weakness, seizures and loss of consciousness.  Endo/Heme/Allergies: Negative for environmental allergies.  Psychiatric/Behavioral: Negative for depression, suicidal ideas and hallucinations.  All other systems reviewed and are negative.  Physical Exam: Blood pressure 92/60, pulse 67, temperature (!) 97.4 F (36.3 C), temperature source Temporal, height 5' (1.524 m), weight 139 lb 6.4 oz (63.2 kg), SpO2 95 %, unknown if currently breastfeeding. Gen:      No acute distress HEENT:  EOMI, sclera anicteric Neck:     No masses; no thyromegaly Lungs:    Clear to auscultation bilaterally; normal respiratory effort CV:         Regular rate and rhythm; no murmurs Abd:      + bowel sounds; soft, non-tender; no palpable masses, no distension Ext:    No edema; adequate peripheral perfusion Skin:      Warm and dry; no rash Neuro: alert and oriented x 3 Psych: normal mood and affect  Data Reviewed: Imaging: X-ray 03/22/2019-multifocal airspace disease.  I have reviewed the images personally   Assessment:  COVID-19 pneumonia Patient has recovered well and appears back to baseline symptomatically We will get a chest x-ray today for reassessment.  If abnormal persistent opacities then consider CT chest  Plan/Recommendations: Chest x-ray  This appointment required 30 minutes of patient care (this includes precharting, chart review, review of results, face-to-face care, etc.).  Chilton Greathouse MD [facility] 04/30/2019, 9:50 AM  CC: Mitchel Honour, DO

## 2019-04-30 NOTE — Patient Instructions (Addendum)
I am glad that your breathing is improving and you are back to normal We will get a chest x-ray today to reevaluate your lung  Follow-up in 4 weeks.

## 2019-05-06 ENCOUNTER — Ambulatory Visit (INDEPENDENT_AMBULATORY_CARE_PROVIDER_SITE_OTHER): Payer: 59

## 2019-05-06 DIAGNOSIS — R0602 Shortness of breath: Secondary | ICD-10-CM | POA: Diagnosis not present

## 2019-05-06 DIAGNOSIS — J1282 Pneumonia due to coronavirus disease 2019: Secondary | ICD-10-CM

## 2019-05-06 DIAGNOSIS — U071 COVID-19: Secondary | ICD-10-CM | POA: Diagnosis not present

## 2019-05-08 ENCOUNTER — Telehealth: Payer: Self-pay

## 2019-05-08 DIAGNOSIS — J1282 Pneumonia due to coronavirus disease 2019: Secondary | ICD-10-CM

## 2019-05-08 NOTE — Telephone Encounter (Signed)
-----   Message from Chilton Greathouse, MD sent at 05/07/2019  5:23 AM EST ----- Chest x ray is much better but not completely back to normal. Please order CXR in 3 months and return to clinic to monitor.

## 2019-06-02 ENCOUNTER — Other Ambulatory Visit: Payer: Self-pay

## 2019-06-02 ENCOUNTER — Ambulatory Visit: Payer: 59 | Admitting: Pulmonary Disease

## 2019-06-02 ENCOUNTER — Encounter: Payer: Self-pay | Admitting: Pulmonary Disease

## 2019-06-02 VITALS — BP 114/72 | HR 66 | Temp 97.6°F | Ht 60.0 in | Wt 138.6 lb

## 2019-06-02 DIAGNOSIS — R0602 Shortness of breath: Secondary | ICD-10-CM

## 2019-06-02 DIAGNOSIS — J1282 Pneumonia due to coronavirus disease 2019: Secondary | ICD-10-CM | POA: Diagnosis not present

## 2019-06-02 DIAGNOSIS — U071 COVID-19: Secondary | ICD-10-CM

## 2019-06-02 NOTE — Patient Instructions (Signed)
I am glad you are doing well with regard to your breathing A chest x-ray shows improving lung infiltrates I will see you back in 2 months with chest x-ray.

## 2019-06-02 NOTE — Progress Notes (Signed)
         Anna Haynes    [id number]    February 06, 1986  Primary Care Physician:Morris, Aundra Millet, DO  Referring Physician: Mitchel Honour, DO [address] [place],  [place] [address]  Chief complaint: Follow-up for post COVID-19 pneumonia  HPI: 34 year old with no significant past medical history.  She was diagnosed with COVID-19 pneumonia and hospitalized from 03/22/19 to 03/26/19. At the time she was [redacted] weeks pregnant.  Admitted to the ICU and treated with remdesivir, dexamethasone.  Due to her respiratory status labor was induced on 12/13 and the baby delivered without any issue  She is doing well post discharge.  Feels that her breathing is back to normal.  Able to exert herself and go up stairs without any issue.  She is not on any supplemental oxygen at present.  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  No mold, hot tub, Smoking history: Never smoker Travel history: No significant travel history Relevant family history: No significant family history of lung disease  Interim history:  She is doing well with no issues.  She has occasional chest tightness when she goes out in the cold otherwise doing okay with no dyspnea, cough, sputum production, fevers, chills  Outpatient Encounter Medications as of 06/02/2019  Medication Sig  . acetaminophen (TYLENOL) 325 MG tablet Take 650 mg by mouth every 6 (six) hours as needed for mild pain or fever.  . Prenatal Vit-Fe Fumarate-FA (PRENATAL MULTIVITAMIN) TABS tablet Take 1 tablet by mouth daily at 12 noon.   No facility-administered encounter medications on file as of 06/02/2019.   Physical Exam: Blood pressure 114/72, pulse 66, temperature 97.6 F (36.4 C), temperature source Temporal, height 5' (1.524 m), weight 138 lb 9.6 oz (62.9 kg), SpO2 100 %, not currently breastfeeding. Gen:      No acute distress HEENT:  EOMI, sclera anicteric Neck:      No masses; no thyromegaly Lungs:    Clear to auscultation bilaterally; normal respiratory effort CV:         Regular rate and rhythm; no murmurs Abd:      + bowel sounds; soft, non-tender; no palpable masses, no distension Ext:    No edema; adequate peripheral perfusion Skin:      Warm and dry; no rash Neuro: alert and oriented x 3 Psych: normal mood and affect,e  Data Reviewed: Imaging: Chest x-ray 03/22/2019-multifocal airspace disease. Chest x-ray 05/06/2019-improved lung volumes and aeration with minimal perihilar I have reviewed the images personally  Assessment:  COVID-19 pneumonia Patient has recovered well and appears back to baseline symptomatically X-ray shows improving lung infiltrates We will continue to follow clinically as she is doing well  Plan/Recommendations: Follow-up in 2 months with x-ray  This appointment required 30 minutes of patient care (this includes precharting, chart review, review of results, face-to-face care, etc.).  Chilton Greathouse MD  [facility] 06/02/2019, 9:48 AM  CC: Mitchel Honour, DO

## 2020-04-29 IMAGING — DX DG CHEST 2V
2 series · 2 of 2 positions shown · non-contrast
Comparison: 03/22/2019

CLINICAL DATA: Dyspnea.  Follow-up COVID pneumonia

EXAM:
CHEST - 2 VIEW

[chest pa]
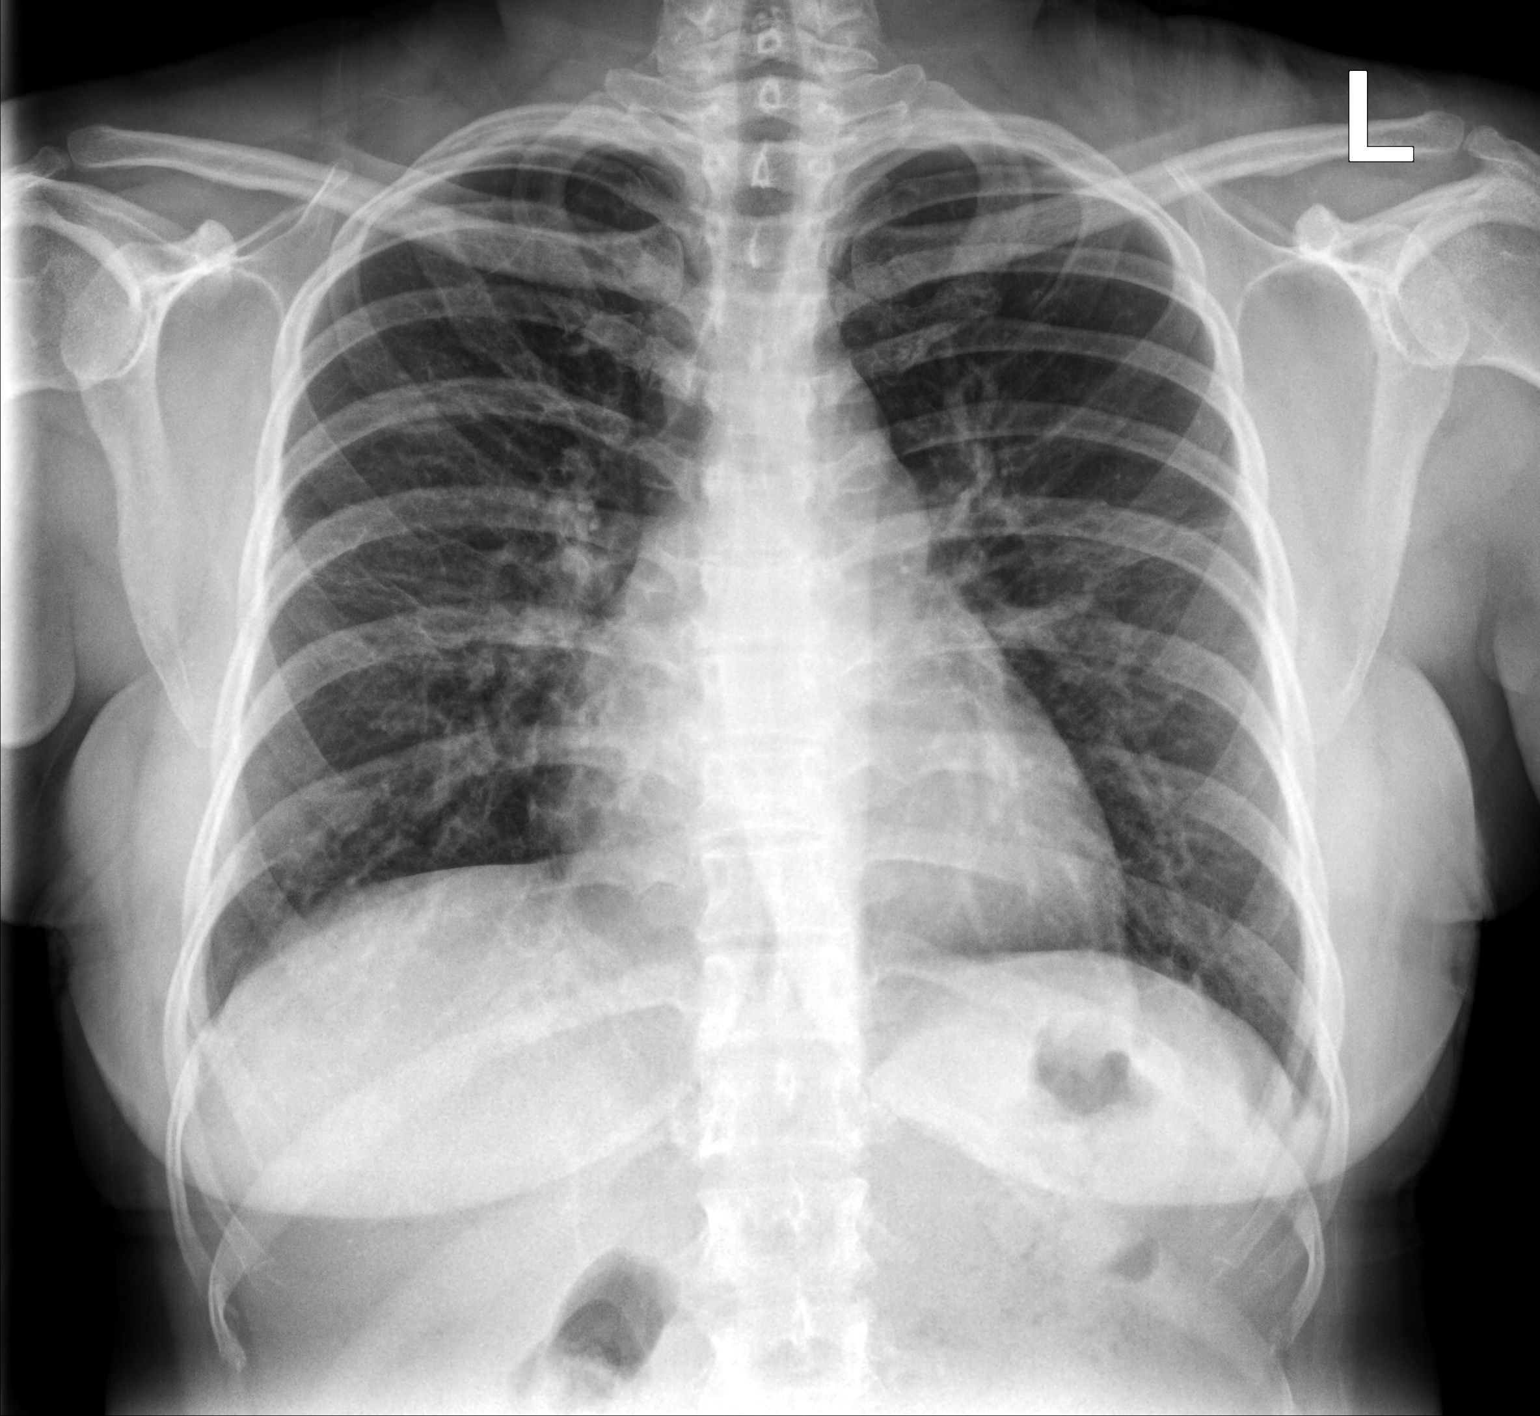

[chest lat]
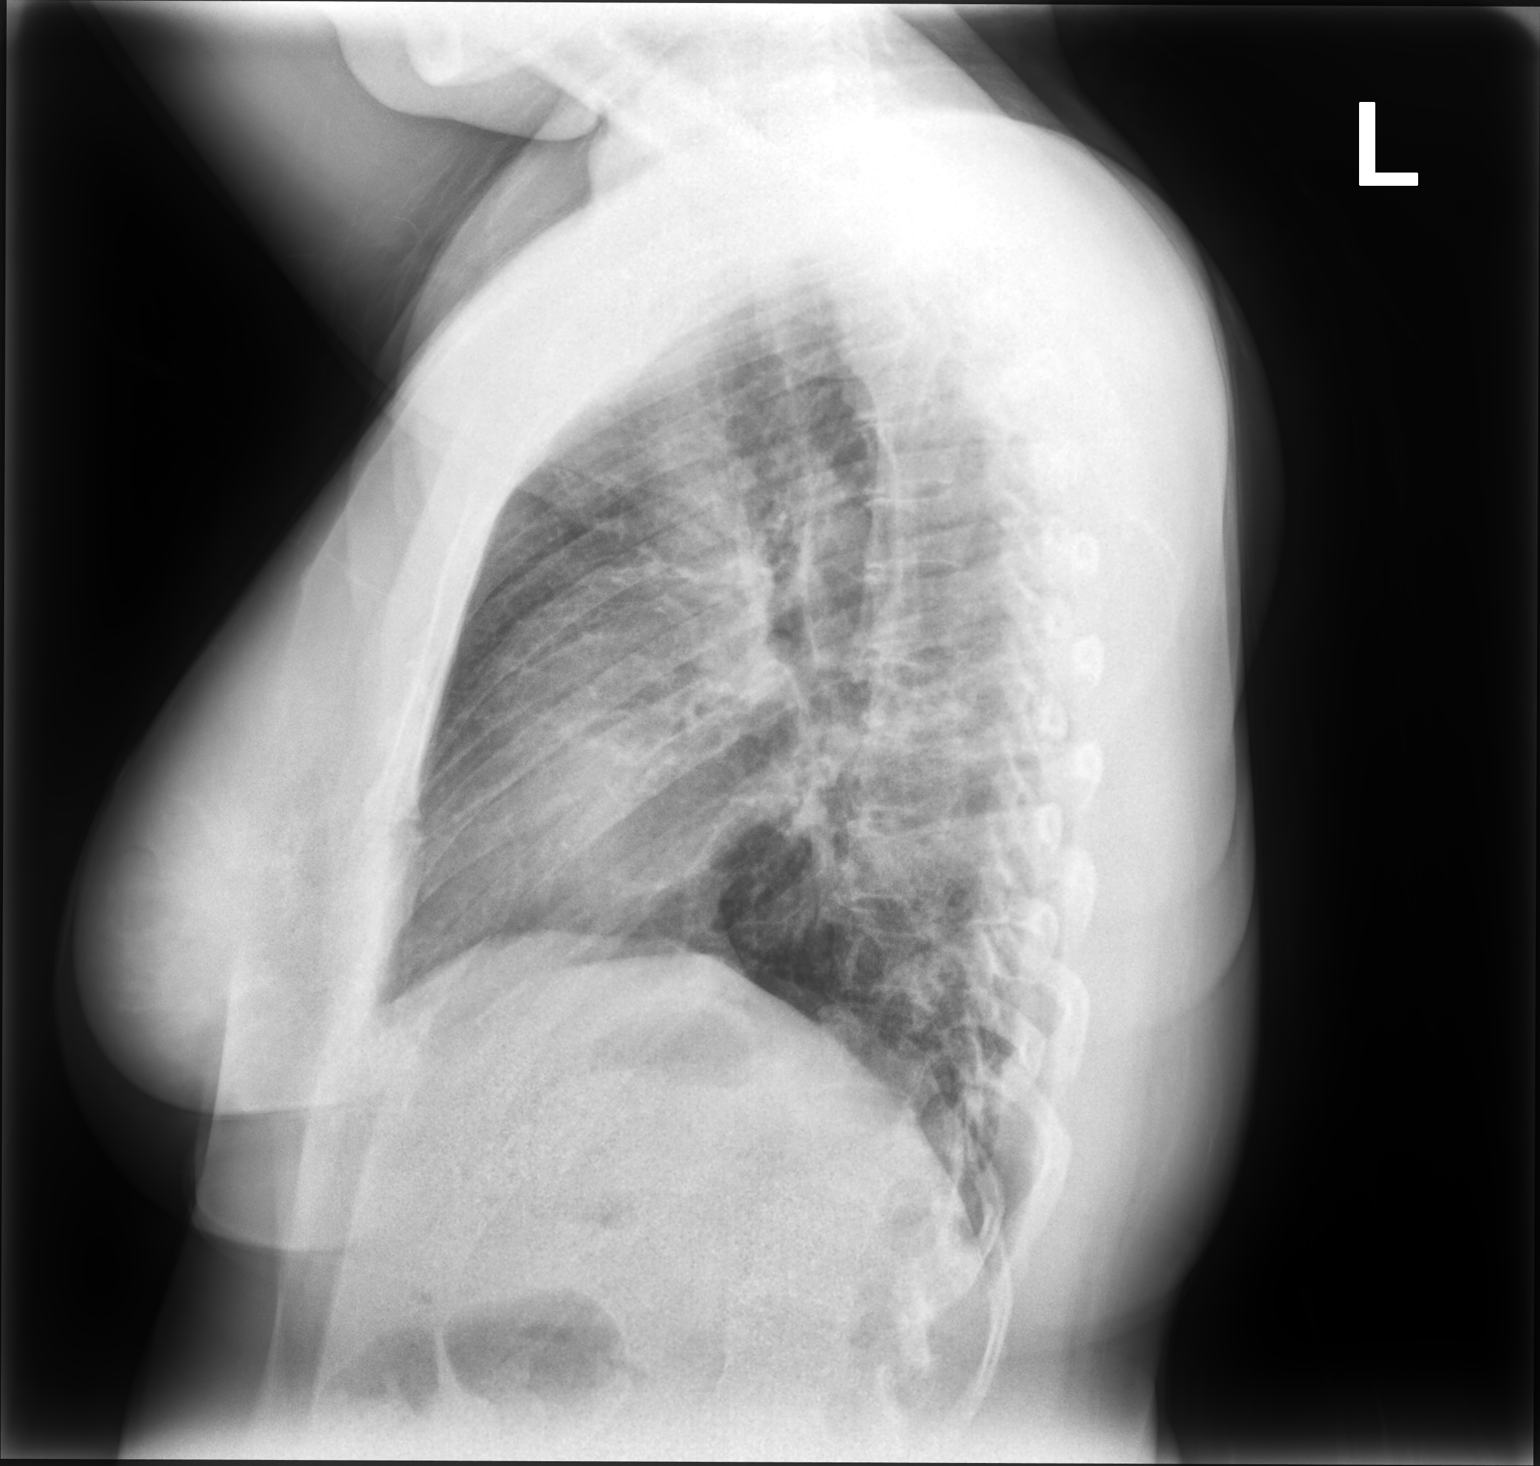

[2 of 2 positions shown; findings below may reference images not displayed]

FINDINGS: Minimal residual perihilar infiltrates. Lung volumes are normalized.
Normal heart size and mediastinal contours.
IMPRESSION: Improved lung volumes and aeration with minimal bilateral perihilar
infiltrate remaining.

## 2023-06-14 ENCOUNTER — Encounter (HOSPITAL_COMMUNITY): Payer: Self-pay

## 2023-06-14 ENCOUNTER — Emergency Department (HOSPITAL_COMMUNITY)
Admission: EM | Admit: 2023-06-14 | Discharge: 2023-06-14 | Disposition: A | Discharge status: Home / Self Care | Attending: Student | Admitting: Student

## 2023-06-14 ENCOUNTER — Other Ambulatory Visit: Payer: Self-pay

## 2023-06-14 ENCOUNTER — Emergency Department (HOSPITAL_COMMUNITY)

## 2023-06-14 DIAGNOSIS — R000 Tachycardia, unspecified: Secondary | ICD-10-CM | POA: Diagnosis not present

## 2023-06-14 DIAGNOSIS — A084 Viral intestinal infection, unspecified: Secondary | ICD-10-CM

## 2023-06-14 DIAGNOSIS — R112 Nausea with vomiting, unspecified: Secondary | ICD-10-CM | POA: Insufficient documentation

## 2023-06-14 DIAGNOSIS — R1013 Epigastric pain: Secondary | ICD-10-CM | POA: Diagnosis present

## 2023-06-14 DIAGNOSIS — M545 Low back pain, unspecified: Secondary | ICD-10-CM | POA: Diagnosis not present

## 2023-06-14 LAB — URINALYSIS, ROUTINE W REFLEX MICROSCOPIC
Bilirubin Urine: NEGATIVE
Glucose, UA: NEGATIVE mg/dL
Hgb urine dipstick: NEGATIVE
Ketones, ur: 5 mg/dL — AB
Leukocytes,Ua: NEGATIVE
Nitrite: NEGATIVE
Protein, ur: NEGATIVE mg/dL
Specific Gravity, Urine: 1.028 (ref 1.005–1.030)
pH: 5 (ref 5.0–8.0)

## 2023-06-14 LAB — CBC WITH DIFFERENTIAL/PLATELET
Abs Immature Granulocytes: 0.03 10*3/uL (ref 0.00–0.07)
Basophils Absolute: 0 10*3/uL (ref 0.0–0.1)
Basophils Relative: 0 %
Eosinophils Absolute: 0.1 10*3/uL (ref 0.0–0.5)
Eosinophils Relative: 1 %
HCT: 45.3 % (ref 36.0–46.0)
Hemoglobin: 14.9 g/dL (ref 12.0–15.0)
Immature Granulocytes: 0 %
Lymphocytes Relative: 7 %
Lymphs Abs: 0.6 10*3/uL — ABNORMAL LOW (ref 0.7–4.0)
MCH: 26.5 pg (ref 26.0–34.0)
MCHC: 32.9 g/dL (ref 30.0–36.0)
MCV: 80.5 fL (ref 80.0–100.0)
Monocytes Absolute: 0.7 10*3/uL (ref 0.1–1.0)
Monocytes Relative: 8 %
Neutro Abs: 6.7 10*3/uL (ref 1.7–7.7)
Neutrophils Relative %: 84 %
Platelets: 249 10*3/uL (ref 150–400)
RBC: 5.63 MIL/uL — ABNORMAL HIGH (ref 3.87–5.11)
RDW: 12.9 % (ref 11.5–15.5)
WBC: 8 10*3/uL (ref 4.0–10.5)
nRBC: 0 % (ref 0.0–0.2)

## 2023-06-14 LAB — RESP PANEL BY RT-PCR (RSV, FLU A&B, COVID)  RVPGX2
Influenza A by PCR: NEGATIVE
Influenza B by PCR: NEGATIVE
Resp Syncytial Virus by PCR: NEGATIVE
SARS Coronavirus 2 by RT PCR: NEGATIVE

## 2023-06-14 LAB — COMPREHENSIVE METABOLIC PANEL
ALT: 22 U/L (ref 0–44)
AST: 25 U/L (ref 15–41)
Albumin: 3.8 g/dL (ref 3.5–5.0)
Alkaline Phosphatase: 44 U/L (ref 38–126)
Anion gap: 10 (ref 5–15)
BUN: 14 mg/dL (ref 6–20)
CO2: 21 mmol/L — ABNORMAL LOW (ref 22–32)
Calcium: 8.6 mg/dL — ABNORMAL LOW (ref 8.9–10.3)
Chloride: 106 mmol/L (ref 98–111)
Creatinine, Ser: 0.95 mg/dL (ref 0.44–1.00)
GFR, Estimated: >60 mL/min (ref >60)
Glucose, Bld: 112 mg/dL — ABNORMAL HIGH (ref 70–99)
Potassium: 3.6 mmol/L (ref 3.5–5.1)
Sodium: 137 mmol/L (ref 135–145)
Total Bilirubin: 0.9 mg/dL (ref 0.0–1.2)
Total Protein: 6.9 g/dL (ref 6.5–8.1)

## 2023-06-14 LAB — HCG, SERUM, QUALITATIVE: Preg, Serum: NEGATIVE

## 2023-06-14 LAB — LIPASE, BLOOD: Lipase: 28 U/L (ref 11–51)

## 2023-06-14 MED ORDER — KETOROLAC TROMETHAMINE 15 MG/ML IJ SOLN
15.0000 mg | Freq: Once | INTRAMUSCULAR | Status: AC
  Administered 2023-06-14: 15 mg via INTRAVENOUS
  Filled 2023-06-14 (×2): qty 1

## 2023-06-14 MED ORDER — HYDROCODONE-ACETAMINOPHEN 5-325 MG PO TABS
1.0000 | ORAL_TABLET | Freq: Once | ORAL | Status: AC
  Administered 2023-06-14: 1 via ORAL
  Filled 2023-06-14: qty 1

## 2023-06-14 MED ORDER — LIDOCAINE 5 % EX PTCH
1.0000 | MEDICATED_PATCH | CUTANEOUS | Status: DC
Start: 2023-06-14 — End: 2023-06-14
  Administered 2023-06-14: 1 via TRANSDERMAL
  Filled 2023-06-14: qty 1

## 2023-06-14 MED ORDER — LACTATED RINGERS IV BOLUS
1000.0000 mL | Freq: Once | INTRAVENOUS | Status: AC
  Administered 2023-06-14: 1000 mL via INTRAVENOUS

## 2023-06-14 MED ORDER — LIDOCAINE 5 % EX PTCH: 1.0000 | MEDICATED_PATCH | CUTANEOUS | 0 refills | Status: AC

## 2023-06-14 MED ORDER — OXYCODONE HCL 5 MG PO TABS: 5.0000 mg | ORAL_TABLET | Freq: Four times a day (QID) | ORAL | 0 refills | Status: AC | PRN

## 2023-06-14 MED ORDER — ONDANSETRON HCL 4 MG/2ML IJ SOLN
4.0000 mg | Freq: Once | INTRAMUSCULAR | Status: AC
  Administered 2023-06-14: 4 mg via INTRAVENOUS
  Filled 2023-06-14: qty 2

## 2023-06-14 MED ORDER — IOHEXOL 350 MG/ML SOLN
75.0000 mL | Freq: Once | INTRAVENOUS | Status: AC | PRN
  Administered 2023-06-14: 75 mL via INTRAVENOUS

## 2023-06-14 MED ORDER — NAPROXEN 375 MG PO TABS: 375.0000 mg | ORAL_TABLET | Freq: Two times a day (BID) | ORAL | 0 refills | Status: AC

## 2023-06-14 MED ORDER — DICYCLOMINE HCL 20 MG PO TABS: 20.0000 mg | ORAL_TABLET | Freq: Two times a day (BID) | ORAL | 0 refills | Status: AC

## 2023-06-14 NOTE — ED Triage Notes (Signed)
 Pt states that she has generalized abdominal pain with nausea and vomiting that radiates into lower back at time. Denies urinary symptoms.

## 2023-06-14 NOTE — ED Provider Notes (Signed)
 [place] EMERGENCY DEPARTMENT AT [facility] Provider Note   CSN: [id number] Arrival date & time: 06/14/23  0227     History  Chief Complaint  Patient presents with   Abdominal Pain    NEEVA TREW is a 38 y.o. female with no significant past medical history presents with concern for epigastric abdominal pain, and vomiting that started yesterday.  States she is throwing up clear liquid, nonbloody.  Denies any diarrhea, dysuria, hematuria, or increased frequency.  Denies fevers but reports chills. Reports family members have been sick with similar symptoms of diarrhea and nausea.  She reports recent travel to [place], but no out of country travel.  Denies any recent antibiotic use.   Abdominal Pain      Home Medications Prior to Admission medications   Medication Sig Start Date End Date Taking? Authorizing Provider  acetaminophen (TYLENOL) 325 MG tablet Take 650 mg by mouth every 6 (six) hours as needed for mild pain or fever.    [provider]  Prenatal Vit-Fe Fumarate-FA (PRENATAL MULTIVITAMIN) TABS tablet Take 1 tablet by mouth daily at 12 noon.    [provider]      Allergies    Amoxicillin and Codeine    Review of Systems   Review of Systems  Gastrointestinal:  Positive for abdominal pain.    Physical Exam Updated Vital Signs BP 95/71   Pulse (!) 102   Temp 98 F (36.7 C) (Oral)   Resp 18   LMP 06/06/2023 (Approximate)   SpO2 100%  Physical Exam Vitals and nursing note reviewed.  Constitutional:      General: She is not in acute distress.    Appearance: She is well-developed.     Comments: Well-appearing, no active vomiting  HENT:     Head: Normocephalic and atraumatic.  Eyes:     Conjunctiva/sclera: Conjunctivae normal.  Cardiovascular:     Rate and Rhythm: Normal rate and regular rhythm.     Heart sounds: No murmur heard. Pulmonary:     Effort: Pulmonary effort is normal. No respiratory distress.     Breath sounds:  Normal breath sounds.  Abdominal:     Palpations: Abdomen is soft.     Tenderness: There is no abdominal tenderness.     Comments: Reports some abdominal tenderness, but I do not appreciate any tenderness on exam. No rebound or gaurding  No CVA tenderness bilaterally  Musculoskeletal:        General: No swelling.     Cervical back: Neck supple.     Comments: No TTP of the spinous processes  Skin:    General: Skin is warm and dry.     Capillary Refill: Capillary refill takes less than 2 seconds.  Neurological:     Mental Status: She is alert.  Psychiatric:        Mood and Affect: Mood normal.     ED Results / Procedures / Treatments   Labs (all labs ordered are listed, but only abnormal results are displayed) Labs Reviewed  COMPREHENSIVE METABOLIC PANEL - Abnormal; Notable for the following components:      Result Value   CO2 21 (*)    Glucose, Bld 112 (*)    Calcium 8.6 (*)    All other components within normal limits  CBC WITH DIFFERENTIAL/PLATELET - Abnormal; Notable for the following components:   RBC 5.63 (*)    Lymphs Abs 0.6 (*)    All other components within normal limits  URINALYSIS,  ROUTINE W REFLEX MICROSCOPIC - Abnormal; Notable for the following components:   Color, Urine AMBER (*)    APPearance HAZY (*)    Ketones, ur 5 (*)    Bacteria, UA RARE (*)    All other components within normal limits  LIPASE, BLOOD  HCG, SERUM, QUALITATIVE    EKG None  Radiology No results found.  Procedures Procedures    Medications Ordered in ED Medications  ondansetron (ZOFRAN) injection 4 mg (4 mg Intravenous Given 06/14/23 0356)  ketorolac (TORADOL) 15 MG/ML injection 15 mg (15 mg Intravenous Given 06/14/23 0400)  lactated ringers bolus 1,000 mL (1,000 mLs Intravenous New Bag/Given 06/14/23 0355)    ED Course/ Medical Decision Making/ A&P                                 Medical Decision Making Amount and/or Complexity of Data Reviewed Labs:  ordered.     Differential diagnosis includes but is not limited to Cholelithiasis, cholangitis, choledocholithiasis, peptic ulcer, gastritis, gastroenteritis, appendicitis, IBS, IBD, DKA, nephrolithiasis, UTI, pyelonephritis, pancreatitis, diverticulitis, mesenteric ischemia, abdominal aortic aneurysm, small bowel obstruction, volvulus, testicular torsion in males, ovarian torsion and pregnancy related concerns in females of childbearing age    ED Course:  Patient well-appearing, stable vital signs aside from a slight tachycardia at 102 upon arrival.  She reports some nausea and vomiting and epigastric pain over the last day.  Reports family members have had similar symptoms.  Also reports left lower back pain, but no CVA tenderness on exam, no urinary symptoms, low concern for UTI/pyelo or nephrolithiasis at this time I Ordered, and personally interpreted labs.  The pertinent results include:   CBC without leukocytosis CMP without any elevation LFTs.  Normal creatinine.  Hypocalcemia 8.6, otherwise normal electrolytes Lipase within normal limits Urinalysis without signs of infection Upon re-evaluation, patient states she did not feel significantly better with the Toradol and Zofran.  Fluids are running.  Discussed that this is likely viral gastroenteritis, however, patient requests CT to rule out any other pathology. This has been ordered.   Impression: Nausea and abdominal pain  Disposition:  Care of this patient signed out to oncoming ED provider Dr. Posey Rea to follow up CT scan results. Disposition and treatment plan pending imaging results and clinical judgment of oncoming ED team.    Imaging Studies ordered: I ordered imaging studies including CT abdomen and pelvis, pending results            Final Clinical Impression(s) / ED Diagnoses Final diagnoses:  None    Rx / DC Orders ED Discharge Orders     None         Arabella Merles, PA-C 06/14/23 0459     Glendora Score, MD 06/14/23 [phone]
